# Patient Record
Sex: Female | Born: 1975 | Race: Black or African American | Hispanic: No | Marital: Married | Smoking: Never smoker
Health system: Southern US, Community
[De-identification: ages and names within clinical notes are randomized; demographics above are authoritative.]

## PROBLEM LIST (undated history)

## (undated) ENCOUNTER — Inpatient Hospital Stay (HOSPITAL_COMMUNITY): Payer: Self-pay

## (undated) DIAGNOSIS — R519 Headache, unspecified: Secondary | ICD-10-CM

## (undated) DIAGNOSIS — Z227 Latent tuberculosis: Secondary | ICD-10-CM

## (undated) DIAGNOSIS — M545 Low back pain, unspecified: Secondary | ICD-10-CM

## (undated) DIAGNOSIS — N926 Irregular menstruation, unspecified: Secondary | ICD-10-CM

## (undated) DIAGNOSIS — R51 Headache: Secondary | ICD-10-CM

## (undated) DIAGNOSIS — O0990 Supervision of high risk pregnancy, unspecified, unspecified trimester: Secondary | ICD-10-CM

## (undated) HISTORY — DX: Low back pain, unspecified: M54.50

## (undated) HISTORY — DX: Supervision of high risk pregnancy, unspecified, unspecified trimester: O09.90

## (undated) HISTORY — DX: Latent tuberculosis: Z22.7

## (undated) HISTORY — DX: Irregular menstruation, unspecified: N92.6

---
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
## 2011-12-19 ENCOUNTER — Other Ambulatory Visit: Payer: Self-pay | Admitting: Infectious Diseases

## 2011-12-19 ENCOUNTER — Ambulatory Visit
Admission: RE | Admit: 2011-12-19 | Discharge: 2011-12-19 | Disposition: A | Discharge status: Home / Self Care | Payer: No Typology Code available for payment source | Source: Ambulatory Visit | Attending: Infectious Diseases | Admitting: Infectious Diseases

## 2011-12-19 DIAGNOSIS — R7611 Nonspecific reaction to tuberculin skin test without active tuberculosis: Secondary | ICD-10-CM

## 2012-05-04 ENCOUNTER — Emergency Department (HOSPITAL_COMMUNITY)
Admission: EM | Admit: 2012-05-04 | Discharge: 2012-05-04 | Disposition: A | Discharge status: Home / Self Care | Payer: Self-pay | Source: Home / Self Care

## 2012-05-04 ENCOUNTER — Emergency Department (INDEPENDENT_AMBULATORY_CARE_PROVIDER_SITE_OTHER): Payer: Medicaid Other

## 2012-05-04 ENCOUNTER — Encounter (HOSPITAL_COMMUNITY): Payer: Self-pay | Admitting: *Deleted

## 2012-05-04 DIAGNOSIS — S8001XA Contusion of right knee, initial encounter: Secondary | ICD-10-CM

## 2012-05-04 DIAGNOSIS — S8000XA Contusion of unspecified knee, initial encounter: Secondary | ICD-10-CM

## 2012-05-04 MED ORDER — IBUPROFEN 800 MG PO TABS
800.0000 mg | ORAL_TABLET | Freq: Once | ORAL | Status: AC
  Administered 2012-05-04: 800 mg via ORAL

## 2012-05-04 MED ORDER — IBUPROFEN 800 MG PO TABS
ORAL_TABLET | ORAL | Status: AC
  Filled 2012-05-04: qty 1

## 2012-05-04 MED ORDER — IBUPROFEN 800 MG PO TABS: 800.0000 mg | ORAL_TABLET | Freq: Three times a day (TID) | ORAL | Status: DC

## 2012-05-04 NOTE — ED Notes (Signed)
Pt. Requested work and school notes.  Notes given as directed by PA.

## 2012-05-04 NOTE — ED Provider Notes (Signed)
History     CSN: [id number]  Arrival date & time 05/04/12  1645   None     Chief Complaint  Patient presents with  . Knee Pain    (Consider location/radiation/quality/duration/timing/severity/associated sxs/prior treatment) Patient is a 36 y.o. female presenting with knee pain. The history is provided by the patient. No language interpreter was used.  Knee Pain This is a new problem. The current episode started 3 to 5 hours ago. The problem occurs constantly. The problem has not changed since onset.The symptoms are aggravated by walking. Nothing relieves the symptoms.   Pt tripped over her shoe string and hit her knee.  Pt complains of swelling and pain History reviewed. No pertinent past medical history.  History reviewed. No pertinent past surgical history.  History reviewed. No pertinent family history.  History  Substance Use Topics  . Smoking status: Never Smoker   . Smokeless tobacco: Not on file  . Alcohol Use: No    OB History    Grav Para Term Preterm Abortions TAB SAB Ect Mult Living                  Review of Systems  Musculoskeletal: Positive for joint swelling.  All other systems reviewed and are negative.    Allergies  Review of patient's allergies indicates no known allergies.  Home Medications  No current outpatient prescriptions on file.  LMP 05/02/2012  Physical Exam  Nursing note and vitals reviewed. Constitutional: She is oriented to person, place, and time. She appears well-developed.  Musculoskeletal: She exhibits tenderness.       Tender right knee,  Swollen   No instability  Neurological: She is alert and oriented to person, place, and time.  Skin: Skin is warm.  Psychiatric: She has a normal mood and affect.    ED Course  Procedures (including critical care time)  Labs Reviewed - No data to display No results found.   No diagnosis found.    MDM  Knee sleeve,  rx for ibuprofen.   I advised follow up with Dr. Lestine Box  for recheck if pain persist past one week       Lonia Skinner Pars[place], [place] 05/04/12 1948

## 2012-05-04 NOTE — ED Notes (Signed)
Pt fell today after tripping on a shoe string.  C/o pain right knee.  Pt is able to ambulate, but limps

## 2012-05-04 NOTE — ED Notes (Signed)
Ice pack applied to right knee

## 2012-05-06 NOTE — ED Provider Notes (Signed)
Medical screening examination/treatment/procedure(s) were performed by resident physician or non-physician practitioner and as supervising physician I was immediately available for consultation/collaboration.   Kemiah Booz DOUGLAS MD.    Layana Konkel D Elchonon Maxson, MD 05/06/12 2126 

## 2012-07-12 ENCOUNTER — Encounter (HOSPITAL_COMMUNITY): Payer: Self-pay | Admitting: Family Medicine

## 2012-07-12 ENCOUNTER — Emergency Department (HOSPITAL_COMMUNITY)
Admission: EM | Admit: 2012-07-12 | Discharge: 2012-07-13 | Disposition: A | Discharge status: Home / Self Care | Payer: Medicaid Other | Attending: Emergency Medicine | Admitting: Emergency Medicine

## 2012-07-12 DIAGNOSIS — R109 Unspecified abdominal pain: Secondary | ICD-10-CM | POA: Insufficient documentation

## 2012-07-12 DIAGNOSIS — O200 Threatened abortion: Secondary | ICD-10-CM | POA: Insufficient documentation

## 2012-07-12 LAB — CBC WITH DIFFERENTIAL/PLATELET
Basophils Absolute: 0 10*3/uL (ref 0.0–0.1)
Eosinophils Absolute: 0.2 10*3/uL (ref 0.0–0.7)
Eosinophils Relative: 4 % (ref 0–5)
MCH: 31.2 pg (ref 26.0–34.0)
MCHC: 34.8 g/dL (ref 30.0–36.0)
MCV: 89.7 fL (ref 78.0–100.0)
Platelets: 298 10*3/uL (ref 150–400)
RDW: 12.2 % (ref 11.5–15.5)

## 2012-07-12 LAB — COMPREHENSIVE METABOLIC PANEL
ALT: 11 U/L (ref 0–35)
AST: 18 U/L (ref 0–37)
Calcium: 9.5 mg/dL (ref 8.4–10.5)
GFR calc Af Amer: >90 mL/min (ref >90)
Glucose, Bld: 95 mg/dL (ref 70–99)
Sodium: 136 mEq/L (ref 135–145)
Total Protein: 7.2 g/dL (ref 6.0–8.3)

## 2012-07-12 LAB — URINE MICROSCOPIC-ADD ON

## 2012-07-12 LAB — URINALYSIS, ROUTINE W REFLEX MICROSCOPIC
Bilirubin Urine: NEGATIVE
Protein, ur: 30 mg/dL — AB
Urobilinogen, UA: 1 mg/dL (ref 0.0–1.0)

## 2012-07-12 NOTE — ED Notes (Addendum)
Patient POCT  Pregnancy  was Positive.Triage Nurse was informed of results.

## 2012-07-12 NOTE — ED Notes (Signed)
Per pt sts positive pregnancy test and now she is bleeding. sts she has been through 3 pads. sts abdominal cramping and back pain.

## 2012-07-13 ENCOUNTER — Emergency Department (HOSPITAL_COMMUNITY): Payer: Medicaid Other

## 2012-07-13 LAB — ABO/RH: ABO/RH(D): O POS

## 2012-07-13 MED ORDER — HYDROCODONE-ACETAMINOPHEN 5-325 MG PO TABS
1.0000 | ORAL_TABLET | Freq: Once | ORAL | Status: AC
  Administered 2012-07-13: 1 via ORAL
  Filled 2012-07-13: qty 1

## 2012-07-13 NOTE — ED Provider Notes (Addendum)
History     CSN: [id number]  Arrival date & time 07/12/12  7829   First MD Initiated Contact with Patient 07/12/12 2338      Chief Complaint  Patient presents with  . Threatened Miscarriage    (Consider location/radiation/quality/duration/timing/severity/associated sxs/prior treatment) Patient is a 37 y.o. female presenting with vaginal bleeding.  Vaginal Bleeding This is a new problem. The current episode started 12 to 24 hours ago. The problem occurs constantly. The problem has not changed since onset.Associated symptoms include abdominal pain. Nothing aggravates the symptoms. Nothing relieves the symptoms. She has tried nothing for the symptoms.    History reviewed. No pertinent past medical history.  History reviewed. No pertinent past surgical history.  History reviewed. No pertinent family history.  History  Substance Use Topics  . Smoking status: Never Smoker   . Smokeless tobacco: Not on file  . Alcohol Use: No    OB History    Grav Para Term Preterm Abortions TAB SAB Ect Mult Living   1               Review of Systems  Gastrointestinal: Positive for abdominal pain.  Genitourinary: Positive for vaginal bleeding.  All other systems reviewed and are negative.    Allergies  Review of patient's allergies indicates no known allergies.  Home Medications  No current outpatient prescriptions on file.  BP 119/72  Pulse 85  Temp 98.3 F (36.8 C)  Resp 18  SpO2 99%  LMP 05/27/2012  Physical Exam  Constitutional: She is oriented to person, place, and time. She appears well-developed and well-nourished.  HENT:  Head: Normocephalic and atraumatic.  Eyes: Conjunctivae normal and EOM are normal. Pupils are equal, round, and reactive to light.  Neck: Normal range of motion.  Cardiovascular: Normal rate, regular rhythm and normal heart sounds.   Pulmonary/Chest: Effort normal and breath sounds normal.  Abdominal: Soft. Bowel sounds are normal.    Genitourinary:       Moderate vag bleeding, with clots  Musculoskeletal: Normal range of motion.  Neurological: She is alert and oriented to person, place, and time.  Skin: Skin is warm and dry.  Psychiatric: She has a normal mood and affect. Her behavior is normal.    ED Course  Procedures (including critical care time)  Labs Reviewed  URINALYSIS, ROUTINE W REFLEX MICROSCOPIC - Abnormal; Notable for the following:    Color, Urine RED (*)  BIOCHEMICALS MAY BE AFFECTED BY COLOR   APPearance CLOUDY (*)     Hgb urine dipstick LARGE (*)     Ketones, ur 15 (*)     Protein, ur 30 (*)     Leukocytes, UA SMALL (*)     All other components within normal limits  COMPREHENSIVE METABOLIC PANEL - Abnormal; Notable for the following:    Total Bilirubin 0.2 (*)     GFR calc non Af Amer 89 (*)     All other components within normal limits  CBC WITH DIFFERENTIAL  URINE MICROSCOPIC-ADD ON   No results found.   No diagnosis found.    MDM  + vag bleeding,  Preg.  Will quant,  [place],  reassess    O+.  + threatened miscarriage.  Will dc to fu    Hajar Penninger Lytle Michaels, MD 07/13/12 0006  Steve Gregg Lytle Michaels, MD 07/13/12 5621  Rosanne Ashing, MD 07/13/12 3086

## 2012-07-13 NOTE — ED Notes (Signed)
Patient has been bleeding off and on.  Used an at home pregnancy test and  It was positive

## 2012-07-15 ENCOUNTER — Inpatient Hospital Stay (HOSPITAL_COMMUNITY)
Admission: AD | Admit: 2012-07-15 | Discharge: 2012-07-15 | Disposition: A | Discharge status: Home / Self Care | Payer: Medicaid Other | Source: Ambulatory Visit | Attending: Obstetrics & Gynecology | Admitting: Obstetrics & Gynecology

## 2012-07-15 ENCOUNTER — Encounter (HOSPITAL_COMMUNITY): Payer: Self-pay

## 2012-07-15 DIAGNOSIS — O209 Hemorrhage in early pregnancy, unspecified: Secondary | ICD-10-CM | POA: Insufficient documentation

## 2012-07-15 NOTE — MAU Provider Note (Signed)
Chief Complaint  Patient presents with  . Vaginal Bleeding    Subjective Nancy Miles 37 y.o.  [id number] [redacted]w[redacted]d by LMP had onset 3 days ago of bleeding like a period with clots. Seen at [facility] and when she went home passed either a large clot or tissue indicating about 4 cm size. Bleeding has been light since then.  Had crampy lower abdominal pain up until today, and now has no pain. 07/13/11: Hgb 13.3, A pos, quant 1633. [place]: IUGS [redacted]w[redacted]d size, no YS, no embryo, otherwise essentially nl US.(see report below).    ROS: No fever/chills, malaise. Denies irritative vaginitis.  No dysuria or hematuria.    Pertinent medical history: [place] Pertinent Ob/Gyn history:P1 C/S transverse lie, VBAC x2 Pertinent surgical history: C/S    Objective     Physical Exam General: WN/WD in NAD  Abdom: soft, NT Pelvic:External genitalia: normal; BUS neg              Bimanual: Cx closed, long; no CMT, scant dark blood                             Uterus anteverted, NT, ULNS                             Adnexae non tender, no masses   Lab Results Results for orders placed during the hospital encounter of 07/15/12 (from the past 24 hour(s))  HCG, QUANTITATIVE, PREGNANCY     Status: Abnormal   Collection Time   07/15/12 10:48 AM      Component Value Range   hCG, Beta Chain, Quant, S 347 (*) <5 mIU/mL    Ultrasound *RADIOLOGY REPORT*  Clinical Data: Threatened abortion.  OBSTETRIC <14 WK [place] AND TRANSVAGINAL OB US  Technique: Both transabdominal and transvaginal ultrasound  examinations were performed for complete evaluation of the  gestation as well as the maternal uterus, adnexal regions, and  pelvic cul-de-sac. Transvaginal technique was performed to assess  early pregnancy.  Comparison: None.  Intrauterine gestational sac: Visualized/normal in shape.  Yolk sac: No  Embryo: No  Cardiac Activity: N/A  MSD: 5.2 mm 5 w 2 d  Maternal uterus/adnexae:  There is mild nonspecific heterogeneity at the lower  uterine  segment; this could reflect mild clot or debris. The uterus is  otherwise unremarkable in appearance.  The ovaries are within normal limits. The right ovary measures 3.0  x 2.9 x 2.0 cm, while the left ovary measures 2.2 x 1.7 x 1.5 cm.  No suspicious adnexal masses are seen; there is no evidence for  ovarian torsion. Limited Doppler evaluation demonstrates normal  color Doppler blood flow with respect to both ovaries.  A small amount of fluid is noted within the pelvic cul-de-sac,  extending to the right adnexa.  IMPRESSION:  1. Single intrauterine gestational sac noted, with a mean sac  diameter of 5.2 mm. This corresponds to a gestational age of [redacted]  weeks 2 days, which does not match the gestational age of [redacted] weeks 5  days by LMP. However, the size of the gestational sac remains too  small to provide an estimated date of delivery. Would perform  follow-up pelvic ultrasound in 2 weeks to ensure viability of  pregnancy, given clinical concern.  2. Mild nonspecific heterogeneity at the lower uterine segment  could reflect mild clot or debris. Uterus otherwise unremarkable  in appearance.  Assessment 1. Bleeding in early pregnancy   Probable completed SAB, but IUP not confirmed.     Plan    GC/CT sent Discharge with AVS on Early Pregnancy Bleeding, ectopic precautions Follow-up Information    Follow up with [facility]. (Someone from GYN clinic will call you for appointment for lab and visit to discuss birth control)    Contact information:   [phone]        POE,DEIRDRE 07/15/2012 10:58 AM

## 2012-07-15 NOTE — MAU Note (Signed)
Patient states she was seen at [facility] ED on 1-20 for bleeding. States she continues to have a little bleeding but no pain.

## 2012-07-15 NOTE — MAU Note (Signed)
Pt states is changing a pad q30 minutes.

## 2012-07-16 ENCOUNTER — Telehealth: Payer: Self-pay | Admitting: Obstetrics and Gynecology

## 2012-07-16 NOTE — Telephone Encounter (Addendum)
Message copied by Toula Moos on Fri Jul 16, 2012  9:04 AM ------      Message from: POE, DEIRDRE C      Created: Thu Jul 15, 2012 12:22 PM  Called patient on the only phone number on file: [phone] and a lady answered the phone stating that this was a wrong number for the name I was calling for. I read back phone number to her and states that the phone number is correct however she does not know anyone by the name of Nancy Miles. Appointment CXL'ed and letter sent to patient by front desk to call us back with correct phone number and make appt as instructed by Diedre Poe below.         Probable SAB. Follow quant down to <2. Schedule quant 1 day before visit in about 10 d to discuss contraception

## 2012-07-20 ENCOUNTER — Encounter: Payer: Self-pay | Admitting: Obstetrics & Gynecology

## 2012-07-20 ENCOUNTER — Other Ambulatory Visit: Payer: Medicaid Other

## 2012-07-30 ENCOUNTER — Ambulatory Visit (INDEPENDENT_AMBULATORY_CARE_PROVIDER_SITE_OTHER): Payer: Medicaid Other | Admitting: Medical

## 2012-07-30 ENCOUNTER — Encounter: Payer: Self-pay | Admitting: Medical

## 2012-07-30 VITALS — BP 104/71 | HR 77 | Temp 98.0°F | Wt 125.5 lb

## 2012-07-30 DIAGNOSIS — O039 Complete or unspecified spontaneous abortion without complication: Secondary | ICD-10-CM | POA: Insufficient documentation

## 2012-07-30 NOTE — Patient Instructions (Signed)
Miscarriage  A miscarriage is the loss of an unborn baby (fetus) before the 20th week of pregnancy. The cause is often unknown.   HOME CARE   You may need to stay in bed (bed rest), or you may be able to do light activity. Go about activity as told by your doctor.   Have help at home.   Write down how many pads you use each day. Write down how soaked they are.   Do not use tampons. Do not wash out your vagina (douche) or have sex (intercourse) until your doctor approves.   Only take medicine as told by your doctor.   Do not take aspirin.   Keep all doctor visits as told.   If you or your partner have problems with grieving, talk to your doctor. You can also try counseling. Give yourself time to grieve before trying to get pregnant again.  GET HELP RIGHT AWAY IF:   You have bad cramps or pain in your back or belly (abdomen).   You have a fever.   You pass large clumps of blood (clots) from your vagina that are walnut-sized or larger. Save the clumps for your doctor to see.   You pass large amounts of tissue from your vagina. Save the tissue for your doctor to see.   You have more bleeding.   You have thick, bad-smelling fluid (discharge) coming from the vagina.   You get lightheaded, weak, or you pass out (faint).   You have chills.  MAKE SURE YOU:   Understand these instructions.   Will watch your condition.   Will get help right away if you are not doing well or get worse.  Document Released: 09/01/2011 Document Reviewed: 09/01/2011  [employer] Patient Information 2013 [employer].

## 2012-07-30 NOTE — Progress Notes (Signed)
Patient ID: Nancy Miles, female   DOB: 1976-01-23, 37 y.o.   MRN: [id number]  History:  Nancy Miles  is a 37 y.o. [phone] who presents to clinic today for follow-up after SAB. The patient was seen in MAU for SAB approximately 2 weeks ago. The patient denies bleeding today. The bleeding had stopped a little over 1 week ago. The patient denies pain today. She has not have fever or N/V. She did not return as requested for her follow-up quant, hCG yesterday. She is interested in the IUD for birth control.    The following portions of the patient's history were reviewed and updated as appropriate: allergies, current medications, past family history, past medical history, past social history, past surgical history and problem list.  Review of Systems:  Pertinent items are noted in HPI.  Objective:  Physical Exam BP 104/71  Pulse 77  Temp 98 F (36.7 C)  Wt 125 lb 8 oz (56.926 kg)  LMP 05/27/2012  Breastfeeding? Unknown GENERAL: Well-developed, well-nourished female in no acute distress.  HEENT: Normocephalic, atraumatic.  LUNGS: Normal rate.  HEART: Regular rate EXTREMITIES: No cyanosis, clubbing, or edema   Labs and Imaging US Ob Comp Less 14 Wks  07/13/2012  *RADIOLOGY REPORT*  Clinical Data: Threatened abortion.  OBSTETRIC <14 WK [place] AND TRANSVAGINAL OB US  Technique:  Both transabdominal and transvaginal ultrasound examinations were performed for complete evaluation of the gestation as well as the maternal uterus, adnexal regions, and pelvic cul-de-sac.  Transvaginal technique was performed to assess early pregnancy.  Comparison:  None.  Intrauterine gestational sac:  Visualized/normal in shape. Yolk sac: No Embryo: No Cardiac Activity: N/A  MSD: 5.2 mm  5 w 2 d  Maternal uterus/adnexae: There is mild nonspecific heterogeneity at the lower uterine segment; this could reflect mild clot or debris.  The uterus is otherwise unremarkable in appearance.  The ovaries are within normal limits.   The right ovary measures 3.0 x 2.9 x 2.0 cm, while the left ovary measures 2.2 x 1.7 x 1.5 cm. No suspicious adnexal masses are seen; there is no evidence for ovarian torsion.  Limited Doppler evaluation demonstrates normal color Doppler blood flow with respect to both ovaries.  A small amount of fluid is noted within the pelvic cul-de-sac, extending to the right adnexa.  IMPRESSION:  1.  Single intrauterine gestational sac noted, with a mean sac diameter of 5.2 mm. This corresponds to a gestational age of [redacted] weeks 2 days, which does not match the gestational age of [redacted] weeks 5 days by LMP.  However, the size of the gestational sac remains too small to provide an estimated date of delivery.  Would perform follow-up pelvic ultrasound in 2 weeks to ensure viability of pregnancy, given clinical concern. 2.  Mild nonspecific heterogeneity at the lower uterine segment could reflect mild clot or debris.  Uterus otherwise unremarkable in appearance.   Original Report Authenticated By: Tonia Ghent, M.D.      Assessment & Plan:  Assessment: S/P SAB - 2 weeks Contraceptive counseling  Plans: 1. Quant, hCG today. Will contact patient with results. If < 5 we can schedule for IUD insertion at patient's convenience. If > 5 patient will return in 1 week for follow-up quant hCG 2. Patient instructed to use condoms with intercourse if she has intercourse prior to IUD insertion 3. Patient may return to clinic PRN or as instructed based on lab results  Freddi Starr, PA-C 07/30/2012 12:16 PM

## 2012-08-02 ENCOUNTER — Telehealth: Payer: Self-pay | Admitting: *Deleted

## 2012-08-02 NOTE — Telephone Encounter (Signed)
Message copied by Gerome Apley on Mon Aug 02, 2012  2:38 PM ------      Message from: Freddi Starr      Created: Fri Jul 30, 2012  7:51 PM       Quant hCG <5.. Please make patient appointment for IUD insertion in 2-3 weeks and call her with the results and appointment information. Thanks! ------

## 2012-08-02 NOTE — Telephone Encounter (Signed)
Called patient with [employer] interpreter, patient's husband answered and stated she was at work. Left message with husband to tell her to call us back for appt information. Patient's husband stated she would call back tomorrow. IUD appt has been made for 2/24 @ 1:45

## 2012-08-04 NOTE — Telephone Encounter (Signed)
Called pt with [employer] interpreter # [phone] and left message that pt has an appt scheduled here at the clinics for 2/24 @ 1:45pm.  If she has any questions to please give the clinics a call and left our return telephone #.

## 2012-08-16 ENCOUNTER — Ambulatory Visit: Payer: Medicaid Other | Admitting: Advanced Practice Midwife

## 2012-08-27 ENCOUNTER — Ambulatory Visit: Payer: Medicaid Other | Admitting: Medical

## 2012-09-17 ENCOUNTER — Ambulatory Visit (INDEPENDENT_AMBULATORY_CARE_PROVIDER_SITE_OTHER): Payer: Medicaid Other | Admitting: Medical

## 2012-09-17 ENCOUNTER — Encounter: Payer: Self-pay | Admitting: Medical

## 2012-09-17 VITALS — BP 99/68 | HR 83 | Temp 97.2°F | Ht 61.5 in | Wt 119.6 lb

## 2012-09-17 DIAGNOSIS — O039 Complete or unspecified spontaneous abortion without complication: Secondary | ICD-10-CM

## 2014-03-19 ENCOUNTER — Emergency Department (INDEPENDENT_AMBULATORY_CARE_PROVIDER_SITE_OTHER)
Admission: EM | Admit: 2014-03-19 | Discharge: 2014-03-19 | Disposition: A | Discharge status: Home / Self Care | Payer: BC Managed Care – PPO | Source: Home / Self Care | Attending: Family Medicine | Admitting: Family Medicine

## 2014-03-19 ENCOUNTER — Encounter (HOSPITAL_COMMUNITY): Payer: Self-pay | Admitting: Emergency Medicine

## 2014-03-19 DIAGNOSIS — J029 Acute pharyngitis, unspecified: Secondary | ICD-10-CM

## 2014-03-19 LAB — POCT RAPID STREP A: Streptococcus, Group A Screen (Direct): NEGATIVE

## 2014-03-19 MED ORDER — IBUPROFEN 100 MG/5ML PO SUSP
ORAL | Status: AC
  Filled 2014-03-19: qty 30

## 2014-03-19 MED ORDER — IBUPROFEN 100 MG/5ML PO SUSP
600.0000 mg | Freq: Once | ORAL | Status: AC
  Administered 2014-03-19: 600 mg via ORAL

## 2014-03-19 MED ORDER — AMOXICILLIN 500 MG PO CAPS: 500.0000 mg | ORAL_CAPSULE | Freq: Three times a day (TID) | ORAL | Status: DC

## 2014-03-19 MED ORDER — IPRATROPIUM BROMIDE 0.06 % NA SOLN: 2.0000 | Freq: Four times a day (QID) | NASAL | Status: DC

## 2014-03-19 NOTE — ED Notes (Signed)
C/O extremely painful swallowing, bilat earache, lymphadenopathy, and mild fevers at home x 3 days.  Has not been taking any meds.

## 2014-03-19 NOTE — ED Provider Notes (Signed)
CSN: [id number]     Arrival date & time 03/19/14  1240 History   First MD Initiated Contact with Patient 03/19/14 1256     Chief Complaint  Patient presents with  . Sore Throat  . Lymphadenopathy   (Consider location/radiation/quality/duration/timing/severity/associated sxs/prior Treatment) Patient is a 38 y.o. female presenting with pharyngitis. The history is provided by the patient.  Sore Throat This is a new problem. The current episode started more than 2 days ago. The problem has been gradually worsening. Pertinent negatives include no chest pain and no abdominal pain. The symptoms are aggravated by swallowing.    Past Medical History  Diagnosis Date  . No pertinent past medical history    Past Surgical History  Procedure Laterality Date  . Cesarean section      transverse lie   Family History  Problem Relation Age of Onset  . Other Neg Hx    History  Substance Use Topics  . Smoking status: Never Smoker   . Smokeless tobacco: Never Used  . Alcohol Use: No   OB History   Grav Para Term Preterm Abortions TAB SAB Ect Mult Living   Review of Systems  Constitutional: Negative.   HENT: Positive for congestion, ear pain, postnasal drip and sore throat.   Respiratory: Negative.   Cardiovascular: Negative.  Negative for chest pain.  Gastrointestinal: Negative for abdominal pain.  Hematological: Positive for adenopathy.    Allergies  Review of patient's allergies indicates no known allergies.  Home Medications   Prior to Admission medications   Medication Sig Start Date End Date Taking? Authorizing Provider  acetaminophen (TYLENOL) 500 MG tablet Take 500 mg by mouth every 6 (six) hours as needed. Takes for pain    Historical Provider, MD  amoxicillin (AMOXIL) 500 MG capsule Take 1 capsule (500 mg total) by mouth 3 (three) times daily. 03/19/14   Linna Hoff, MD  ipratropium (ATROVENT) 0.06 % nasal spray Place 2 sprays into both nostrils 4 (four)  times daily. 03/19/14   Linna Hoff, MD   BP 103/73  Pulse 84  Temp(Src) 98.2 F (36.8 C) (Oral)  Resp 16  SpO2 98% Physical Exam  Nursing note and vitals reviewed. Constitutional: She appears well-developed and well-nourished.  HENT:  Right Ear: External ear normal.  Left Ear: External ear normal.  Mouth/Throat: Uvula is midline and mucous membranes are normal. Posterior oropharyngeal erythema present. No oropharyngeal exudate.  Eyes: Conjunctivae are normal. Pupils are equal, round, and reactive to light.  Neck: Normal range of motion. Neck supple.  Lymphadenopathy:    She has cervical adenopathy.    ED Course  Procedures (including critical care time) Labs Review Labs Reviewed  POCT RAPID STREP A ([facility] CARE ONLY)    Imaging Review No results found.   MDM   1. Acute pharyngitis, unspecified pharyngitis type        Linna Hoff, MD 03/19/14 1314

## 2014-03-21 LAB — CULTURE, GROUP A STREP

## 2014-04-24 ENCOUNTER — Encounter (HOSPITAL_COMMUNITY): Payer: Self-pay | Admitting: Emergency Medicine

## 2016-09-08 ENCOUNTER — Inpatient Hospital Stay (HOSPITAL_COMMUNITY)
Admission: AD | Admit: 2016-09-08 | Discharge: 2016-09-08 | Disposition: A | Discharge status: Home / Self Care | Payer: PRIVATE HEALTH INSURANCE | Source: Ambulatory Visit | Attending: Obstetrics and Gynecology | Admitting: Obstetrics and Gynecology

## 2016-09-08 ENCOUNTER — Encounter (HOSPITAL_COMMUNITY): Payer: Self-pay | Admitting: *Deleted

## 2016-09-08 ENCOUNTER — Ambulatory Visit (HOSPITAL_COMMUNITY): Admission: EM | Admit: 2016-09-08 | Discharge: 2016-09-08 | Discharge status: Absent without leave | Payer: Self-pay

## 2016-09-08 ENCOUNTER — Inpatient Hospital Stay (HOSPITAL_COMMUNITY): Payer: PRIVATE HEALTH INSURANCE

## 2016-09-08 DIAGNOSIS — N760 Acute vaginitis: Secondary | ICD-10-CM | POA: Insufficient documentation

## 2016-09-08 DIAGNOSIS — B9689 Other specified bacterial agents as the cause of diseases classified elsewhere: Secondary | ICD-10-CM | POA: Insufficient documentation

## 2016-09-08 DIAGNOSIS — O418X10 Other specified disorders of amniotic fluid and membranes, first trimester, not applicable or unspecified: Secondary | ICD-10-CM

## 2016-09-08 DIAGNOSIS — Z3A01 Less than 8 weeks gestation of pregnancy: Secondary | ICD-10-CM | POA: Diagnosis not present

## 2016-09-08 DIAGNOSIS — O23591 Infection of other part of genital tract in pregnancy, first trimester: Secondary | ICD-10-CM | POA: Diagnosis not present

## 2016-09-08 DIAGNOSIS — O468X1 Other antepartum hemorrhage, first trimester: Secondary | ICD-10-CM

## 2016-09-08 DIAGNOSIS — O208 Other hemorrhage in early pregnancy: Secondary | ICD-10-CM | POA: Insufficient documentation

## 2016-09-08 DIAGNOSIS — R109 Unspecified abdominal pain: Secondary | ICD-10-CM | POA: Diagnosis present

## 2016-09-08 DIAGNOSIS — O26899 Other specified pregnancy related conditions, unspecified trimester: Secondary | ICD-10-CM

## 2016-09-08 HISTORY — DX: Headache, unspecified: R51.9

## 2016-09-08 HISTORY — DX: Headache: R51

## 2016-09-08 LAB — CBC WITH DIFFERENTIAL/PLATELET
BASOS ABS: 0 10*3/uL (ref 0.0–0.1)
BASOS PCT: 0 %
EOS ABS: 0.1 10*3/uL (ref 0.0–0.7)
Eosinophils Relative: 1 %
HCT: 35.3 % — ABNORMAL LOW (ref 36.0–46.0)
Hemoglobin: 12.2 g/dL (ref 12.0–15.0)
Lymphocytes Relative: 39 %
Lymphs Abs: 2.1 10*3/uL (ref 0.7–4.0)
MCH: 31.1 pg (ref 26.0–34.0)
MCHC: 34.6 g/dL (ref 30.0–36.0)
MCV: 90.1 fL (ref 78.0–100.0)
MONO ABS: 0.4 10*3/uL (ref 0.1–1.0)
MONOS PCT: 7 %
NEUTROS ABS: 2.8 10*3/uL (ref 1.7–7.7)
NEUTROS PCT: 53 %
Platelets: 331 10*3/uL (ref 150–400)
RBC: 3.92 MIL/uL (ref 3.87–5.11)
RDW: 12.5 % (ref 11.5–15.5)
WBC: 5.4 10*3/uL (ref 4.0–10.5)

## 2016-09-08 LAB — WET PREP, GENITAL
Sperm: NONE SEEN
TRICH WET PREP: NONE SEEN
YEAST WET PREP: NONE SEEN

## 2016-09-08 LAB — URINALYSIS, ROUTINE W REFLEX MICROSCOPIC
BILIRUBIN URINE: NEGATIVE
Bacteria, UA: NONE SEEN
Glucose, UA: NEGATIVE mg/dL
Ketones, ur: NEGATIVE mg/dL
NITRITE: NEGATIVE
Protein, ur: NEGATIVE mg/dL
SPECIFIC GRAVITY, URINE: 1.02 (ref 1.005–1.030)
pH: 5 (ref 5.0–8.0)

## 2016-09-08 LAB — POCT PREGNANCY, URINE: Preg Test, Ur: POSITIVE — AB

## 2016-09-08 LAB — HCG, QUANTITATIVE, PREGNANCY: HCG, BETA CHAIN, QUANT, S: 3008 m[IU]/mL — AB (ref <5)

## 2016-09-08 MED ORDER — METRONIDAZOLE 500 MG PO TABS: 500.0000 mg | ORAL_TABLET | Freq: Two times a day (BID) | ORAL | 0 refills | Status: DC

## 2016-09-08 NOTE — MAU Note (Signed)
c/o cabdominal cramping and lower back pain for past 2 days; c/o spotting for past 2 days also;  UPT in MAU today is positive;

## 2016-09-08 NOTE — Discharge Instructions (Signed)
Bacterial Vaginosis °Bacterial vaginosis is an infection of the vagina. It happens when too many germs (bacteria) grow in the vagina. This infection puts you at risk for infections from sex (STIs). Treating this infection can lower your risk for some STIs. You should also treat this if you are pregnant. It can cause your baby to be born early. °Follow these instructions at home: °Medicines °· Take over-the-counter and prescription medicines only as told by your doctor. °· Take or use your antibiotic medicine as told by your doctor. Do not stop taking or using it even if you start to feel better. °General instructions °· If you your sexual partner is a woman, tell her that you have this infection. She needs to get treatment if she has symptoms. If you have a female partner, he does not need to be treated. °· During treatment: °? Avoid sex. °? Do not douche. °? Avoid alcohol as told. °? Avoid breastfeeding as told. °· Drink enough fluid to keep your pee (urine) clear or pale yellow. °· Keep your vagina and butt (rectum) clean. °? Wash the area with warm water every day. °? Wipe from front to back after you use the toilet. °· Keep all follow-up visits as told by your doctor. This is important. °Preventing this condition °· Do not douche. °· Use only warm water to wash around your vagina. °· Use protection when you have sex. This includes: °? Latex condoms. °? Dental dams. °· Limit how many people you have sex with. It is best to only have sex with the same person (be monogamous). °· Get tested for STIs. Have your partner get tested. °· Wear underwear that is cotton or lined with cotton. °· Avoid tight pants and pantyhose. This is most important in summer. °· Do not use any products that have nicotine or tobacco in them. These include cigarettes and e-cigarettes. If you need help quitting, ask your doctor. °· Do not use illegal drugs. °· Limit how much alcohol you drink. °Contact a doctor if: °· Your symptoms do not get  better, even after you are treated. °· You have more discharge or pain when you pee (urinate). °· You have a fever. °· You have pain in your belly (abdomen). °· You have pain with sex. °· Your bleed from your vagina between periods. °Summary °· This infection happens when too many germs (bacteria) grow in the vagina. °· Treating this condition can lower your risk for some infections from sex (STIs). °· You should also treat this if you are pregnant. It can cause early (premature) birth. °· Do not stop taking or using your antibiotic medicine even if you start to feel better. °This information is not intended to replace advice given to you by your health care provider. Make sure you discuss any questions you have with your health care provider. °Document Released: 03/18/2008 Document Revised: 02/23/2016 Document Reviewed: 02/23/2016 °Elsevier Interactive Patient Education © 2017 Elsevier Inc. °Subchorionic Hematoma °A subchorionic hematoma is a gathering of blood between the outer wall of the placenta and the inner wall of the womb (uterus). The placenta is the organ that connects the fetus to the wall of the uterus. The placenta performs the feeding, breathing (oxygen to the fetus), and waste removal (excretory work) of the fetus. °Subchorionic hematoma is the most common abnormality found on a result from ultrasonography done during the first trimester or early second trimester of pregnancy. If there has been little or no vaginal bleeding, early small hematomas usually   shrink on their own and do not affect your baby or pregnancy. The blood is gradually absorbed over 1-2 weeks. When bleeding starts later in pregnancy or the hematoma is larger or occurs in an older pregnant woman, the outcome may not be as good. Larger hematomas may get bigger, which increases the chances for miscarriage. Subchorionic hematoma also increases the risk of premature detachment of the placenta from the uterus, preterm (premature) labor,  and stillbirth. °Follow these instructions at home: °· Stay on bed rest if your health care provider recommends this. Although bed rest will not prevent more bleeding or prevent a miscarriage, your health care provider may recommend bed rest until you are advised otherwise. °· Avoid heavy lifting (more than 10 lb [4.5 kg]), exercise, sexual intercourse, or douching as directed by your health care provider. °· Keep track of the number of pads you use each day and how soaked (saturated) they are. Write down this information. °· Do not use tampons. °· Keep all follow-up appointments as directed by your health care provider. Your health care provider may ask you to have follow-up blood tests or ultrasound tests or both. °Get help right away if: °· You have severe cramps in your stomach, back, abdomen, or pelvis. °· You have a fever. °· You pass large clots or tissue. Save any tissue for your health care provider to look at. °· Your bleeding increases or you become lightheaded, feel weak, or have fainting episodes. °This information is not intended to replace advice given to you by your health care provider. Make sure you discuss any questions you have with your health care provider. °Document Released: 09/24/2006 Document Revised: 11/15/2015 Document Reviewed: 01/06/2013 °Elsevier Interactive Patient Education © 2017 Elsevier Inc. ° °

## 2016-09-08 NOTE — [facility] Provider Note (Signed)
History     CSN: [id number]  Arrival date and time: 09/08/16 1719   First Provider Initiated Contact with Patient 09/08/16 1753      Chief Complaint  Patient presents with  . Abdominal Pain  . Back Pain  . Vaginal Bleeding   HPI Ms. Nancy Miles is a 40 y.o. G5P3003 at [redacted]w[redacted]d who presents to [facility] today with complaint of vaginal bleeding and lower abdomen and back pain x 2 days. The patient states LMP 07/19/16 and +HPT recently. She states only very small amount of blood noted so far. She states back pain is moderate and abdominal cramping in minimal. She has not taken anything for pain. She denies fever, N/V/D or constipation or UTI symptoms.   OB History    Gravida Para Term Preterm AB Living   5 3 3     3    SAB TAB Ectopic Multiple Live Births           3      Past Medical History:  Diagnosis Date  . Headache   . No pertinent past medical history     Past Surgical History:  Procedure Laterality Date  . CESAREAN SECTION     transverse lie    Family History  Problem Relation Age of Onset  . Other Neg Hx     Social History  Substance Use Topics  . Smoking status: Never Smoker  . Smokeless tobacco: Never Used  . Alcohol use No    Allergies: No Known Allergies  Prescriptions Prior to Admission  Medication Sig Dispense Refill Last Dose  . ibuprofen (ADVIL,MOTRIN) 200 MG tablet Take 400 mg by mouth every 6 (six) hours as needed for headache.   09/07/2016 at Unknown time  . amoxicillin (AMOXIL) 500 MG capsule Take 1 capsule (500 mg total) by mouth 3 (three) times daily. (Patient not taking: Reported on 09/08/2016) 30 capsule 0 Not Taking at Unknown time  . ipratropium (ATROVENT) 0.06 % nasal spray Place 2 sprays into both nostrils 4 (four) times daily. (Patient not taking: Reported on 09/08/2016) 15 mL 1 Not Taking at Unknown time    Review of Systems  Constitutional: Negative for fever.  Gastrointestinal: Positive for abdominal pain. Negative for constipation,  diarrhea, nausea and vomiting.  Genitourinary: Positive for vaginal bleeding. Negative for dysuria, frequency, urgency and vaginal discharge.  Musculoskeletal: Positive for back pain.   Physical Exam   Blood pressure 106/64, pulse 85, temperature 98.3 F (36.8 C), temperature source Oral, resp. rate 16, last menstrual period 07/19/2016.  Physical Exam  Nursing note and vitals reviewed. Constitutional: She is oriented to person, place, and time. She appears well-developed and well-nourished. No distress.  HENT:  Head: Normocephalic and atraumatic.  Cardiovascular: Normal rate.   Respiratory: Effort normal.  GI: Soft. She exhibits no distension and no mass. There is no tenderness. There is no rebound and no guarding.  Genitourinary: Uterus is enlarged (slightly). Uterus is not tender. Cervix exhibits no motion tenderness, no discharge and no friability. Right adnexum displays no mass and no tenderness. Left adnexum displays no mass and no tenderness. There is bleeding (scant blood) in the vagina. No vaginal discharge found.  Neurological: She is alert and oriented to person, place, and time.  Skin: Skin is warm and dry. No erythema.  Psychiatric: She has a normal mood and affect.    Results for orders placed or performed during the hospital encounter of 09/08/16 (from the past 24 hour(s))  Urinalysis, Routine w reflex microscopic  Status: Abnormal   Collection Time: 09/08/16  5:30 PM  Result Value Ref Range   Color, Urine YELLOW YELLOW   APPearance CLEAR CLEAR   Specific Gravity, Urine 1.020 1.005 - 1.030   pH 5.0 5.0 - 8.0   Glucose, UA NEGATIVE NEGATIVE mg/dL   Hgb urine dipstick LARGE (A) NEGATIVE   Bilirubin Urine NEGATIVE NEGATIVE   Ketones, ur NEGATIVE NEGATIVE mg/dL   Protein, ur NEGATIVE NEGATIVE mg/dL   Nitrite NEGATIVE NEGATIVE   Leukocytes, UA TRACE (A) NEGATIVE   RBC / HPF 6-30 0 - 5 RBC/hpf   WBC, UA 0-5 0 - 5 WBC/hpf   Bacteria, UA NONE SEEN NONE SEEN    Squamous Epithelial / LPF 0-5 (A) NONE SEEN   Mucous PRESENT   Pregnancy, urine POC     Status: Abnormal   Collection Time: 09/08/16  5:35 PM  Result Value Ref Range   Preg Test, Ur POSITIVE (A) NEGATIVE  Wet prep, genital     Status: Abnormal   Collection Time: 09/08/16  6:00 PM  Result Value Ref Range   Yeast Wet Prep HPF POC NONE SEEN NONE SEEN   Trich, Wet Prep NONE SEEN NONE SEEN   Clue Cells Wet Prep HPF POC PRESENT (A) NONE SEEN   WBC, Wet Prep HPF POC MODERATE (A) NONE SEEN   Sperm NONE SEEN   CBC with Differential/Platelet     Status: Abnormal   Collection Time: 09/08/16  6:17 PM  Result Value Ref Range   WBC 5.4 4.0 - 10.5 K/uL   RBC 3.92 3.87 - 5.11 MIL/uL   Hemoglobin 12.2 12.0 - 15.0 g/dL   HCT 45.435.3 (L) 09.836.0 - 11.946.0 %   MCV 90.1 78.0 - 100.0 fL   MCH 31.1 26.0 - 34.0 pg   MCHC 34.6 30.0 - 36.0 g/dL   RDW 14.712.5 82.911.5 - 56.215.5 %   Platelets 331 150 - 400 K/uL   Neutrophils Relative % 53 %   Neutro Abs 2.8 1.7 - 7.7 K/uL   Lymphocytes Relative 39 %   Lymphs Abs 2.1 0.7 - 4.0 K/uL   Monocytes Relative 7 %   Monocytes Absolute 0.4 0.1 - 1.0 K/uL   Eosinophils Relative 1 %   Eosinophils Absolute 0.1 0.0 - 0.7 K/uL   Basophils Relative 0 %   Basophils Absolute 0.0 0.0 - 0.1 K/uL   [place] Ob Comp Less 14 Wks  Result Date: 09/08/2016 CLINICAL DATA:  Abdominal cramping low back pain and spotting EXAM: OBSTETRIC <14 WK US AND TRANSVAGINAL OB US TECHNIQUE: Both transabdominal and transvaginal ultrasound examinations were performed for complete evaluation of the gestation as well as the maternal uterus, adnexal regions, and pelvic cul-de-sac. Transvaginal technique was performed to assess early pregnancy. COMPARISON:  None. FINDINGS: Intrauterine gestational sac: Visualized Yolk sac:  Visualized Embryo:  Visualized Cardiac Activity: Not visualized CRL:  2  mm   5 w   5 d                  US EDC: 05/06/2017 Subchorionic hemorrhage: Small subchorionic hemorrhage along the left inferior  aspect of the sac. Maternal uterus/adnexae: Left ovary nonvisualized. Right ovary measures 4.7 x 2.7 by 3.5 cm. Two adjacent cysts measuring 3.6 cm in aggregate. No significant free fluid. IMPRESSION: Single intrauterine gestation. An embryo is visualized however fetal cardiac activity is not visualized. Findings are suspicious but not yet definitive for failed pregnancy. Recommend follow-up US in 10-14 days for definitive diagnosis. This recommendation  follows SRU consensus guidelines: Diagnostic Criteria for Nonviable Pregnancy Early in the First Trimester. Malva Limes Med 2013; 161:0960-45. Small subchorionic hemorrhage. Electronically Signed   By: Jasmine Pang M.D.   On: 09/08/2016 19:20   US Ob Transvaginal  Result Date: 09/08/2016 CLINICAL DATA:  Abdominal cramping low back pain and spotting EXAM: OBSTETRIC <14 WK [place] AND TRANSVAGINAL OB US TECHNIQUE: Both transabdominal and transvaginal ultrasound examinations were performed for complete evaluation of the gestation as well as the maternal uterus, adnexal regions, and pelvic cul-de-sac. Transvaginal technique was performed to assess early pregnancy. COMPARISON:  None. FINDINGS: Intrauterine gestational sac: Visualized Yolk sac:  Visualized Embryo:  Visualized Cardiac Activity: Not visualized CRL:  2  mm   5 w   5 d                  [place] EDC: 05/06/2017 Subchorionic hemorrhage: Small subchorionic hemorrhage along the left inferior aspect of the sac. Maternal uterus/adnexae: Left ovary nonvisualized. Right ovary measures 4.7 x 2.7 by 3.5 cm. Two adjacent cysts measuring 3.6 cm in aggregate. No significant free fluid. IMPRESSION: Single intrauterine gestation. An embryo is visualized however fetal cardiac activity is not visualized. Findings are suspicious but not yet definitive for failed pregnancy. Recommend follow-up US in 10-14 days for definitive diagnosis. This recommendation follows SRU consensus guidelines: Diagnostic Criteria for Nonviable Pregnancy Early  in the First Trimester. Malva Limes Med 2013; 409:8119-14. Small subchorionic hemorrhage. Electronically Signed   By: Jasmine Pang M.D.   On: 09/08/2016 19:20    MAU Course  Procedures None  MDM +UPT UA, wet prep, GC/chlamydia, CBC, quant hCG, HIV, RPR and [place] today to rule out ectopic pregnancy Discussed [place] results with patient. Dating is not consistent with LMP, but this was first LMP since stopping birth control. Will follow-up with [place] in 14 days to confirm viability prior to starting prenatal care.  Assessment and Plan  A: IUGS and YS at [redacted]w[redacted]d Small subchorionic hemorrhage Bacterial vaginosis   P: Discharge home Rx for Flagyl given to patient  Outpatient [place] ordered for 2 weeks, then follow-up for results in the [facility] afterwards Bleeding precautions discussed Patient may return to [facility]U as needed or if her condition were to change or worsen   Marny Lowenstein, PA-C  09/08/2016, 7:43 PM

## 2016-09-09 LAB — GC/CHLAMYDIA PROBE AMP (~~LOC~~) NOT AT ARMC
CHLAMYDIA, DNA PROBE: NEGATIVE
NEISSERIA GONORRHEA: NEGATIVE

## 2016-09-09 LAB — HIV ANTIBODY (ROUTINE TESTING W REFLEX): HIV Screen 4th Generation wRfx: NONREACTIVE

## 2016-09-09 LAB — RPR: RPR Ser Ql: NONREACTIVE

## 2016-09-18 ENCOUNTER — Ambulatory Visit (INDEPENDENT_AMBULATORY_CARE_PROVIDER_SITE_OTHER): Payer: PRIVATE HEALTH INSURANCE | Admitting: Obstetrics & Gynecology

## 2016-09-18 ENCOUNTER — Ambulatory Visit (HOSPITAL_COMMUNITY)
Admission: RE | Admit: 2016-09-18 | Discharge: 2016-09-18 | Disposition: A | Discharge status: Home / Self Care | Payer: PRIVATE HEALTH INSURANCE | Source: Ambulatory Visit | Attending: Medical | Admitting: Medical

## 2016-09-18 DIAGNOSIS — O208 Other hemorrhage in early pregnancy: Secondary | ICD-10-CM | POA: Diagnosis not present

## 2016-09-18 DIAGNOSIS — O26891 Other specified pregnancy related conditions, first trimester: Secondary | ICD-10-CM | POA: Insufficient documentation

## 2016-09-18 DIAGNOSIS — O039 Complete or unspecified spontaneous abortion without complication: Secondary | ICD-10-CM

## 2016-09-18 DIAGNOSIS — Z3A01 Less than 8 weeks gestation of pregnancy: Secondary | ICD-10-CM | POA: Insufficient documentation

## 2016-09-18 DIAGNOSIS — N854 Malposition of uterus: Secondary | ICD-10-CM | POA: Insufficient documentation

## 2016-09-18 DIAGNOSIS — O34531 Maternal care for retroversion of gravid uterus, first trimester: Secondary | ICD-10-CM | POA: Insufficient documentation

## 2016-09-18 DIAGNOSIS — R109 Unspecified abdominal pain: Secondary | ICD-10-CM | POA: Insufficient documentation

## 2016-09-18 DIAGNOSIS — O468X1 Other antepartum hemorrhage, first trimester: Secondary | ICD-10-CM

## 2016-09-18 DIAGNOSIS — O26899 Other specified pregnancy related conditions, unspecified trimester: Secondary | ICD-10-CM

## 2016-09-18 DIAGNOSIS — O418X10 Other specified disorders of amniotic fluid and membranes, first trimester, not applicable or unspecified: Secondary | ICD-10-CM | POA: Insufficient documentation

## 2016-09-18 NOTE — Progress Notes (Signed)
   Subjective:    Patient ID: Nancy Miles, female    DOB: 09/03/1975, 41 y.o.   MRN: [id number]  HPI  41 yo M P3 here to discuss her u/s results which show a spontaneous miscarriage. She has stopped bleeding.  She does not want any more kids. Review of Systems O+    Objective:   Physical Exam [id number] Breathing, conversing, and ambulating normally       Assessment & Plan:  Spontaneous miscarriage- reassurance given Rec get IUD at the health dept although [facility]

## 2016-09-18 NOTE — Patient Instructions (Addendum)
Natural Family Planning Introduction Natural Family Planning (NFP) is a type of birth control without using any form of contraception. Women who use NFP should not have sexual intercourse when the ovary produces an egg (ovulation) during the menstrual cycle. The NFP method is safe and can prevent pregnancy. It is 75% effective when practiced right. The man needs to also understand this method of birth control and the woman needs to be aware of how her body functions during her menstrual cycle. NFP can also be used as a method of getting pregnant. HOW THE NFP METHOD WORKS  A woman's menstrual period usually happens every 28-30 days (it can vary from 23-35 days).  Ovulation happens 12-14 days before the start of the next menstrual period (the fertile period). The egg is fertile for 24 hours and the sperm can live for 3 days or more. If there is sexual intercourse at this time, pregnancy can occur. THERE ARE MANY TYPES OF NFP METHODS USED TO PREVENT PREGNANCY  The basal body temperature method. Often times, there is a slight increase of body temperature when a woman ovulates. Take your temperature every morning before getting out of bed. Write the temperature on a chart. An increase in the temperature shows ovulation has happened. Do not have sexual intercourse from the menstrual period up to three days after the increase in the temperature. Note that the body temperature may increase as a result of fever, restless sleep, and working schedules.  The ovulation cervical mucus method. During the menstrual cycle, the cervical mucus changes from dry and sticky to wet and slippery. Check the mucus of the vagina every day to look for these changes. Just before ovulation, the mucus becomes wet and slippery. On the last day of wetness, ovulation happens. To avoid getting pregnant, sexual intercourse is safe for about 10 days after the menstrual period and on the dry mucus days. Do not have sexual intercourse when  the mucus starts to show up and not until 4 days after the wet and slippery mucus goes away. Sexual intercourse after the 4 days have passed until the menstrual period starts is a safe time. Note that the mucus from the vagina can increase because of a vaginal or cervical infection, lubricants, some medicines, and sexual excitement.  The symptothermal method. This method uses both the temperature and the ovulation methods. Combine the two methods above to prevent pregnancy.  The calendar method. Record your menstrual periods and length of the cycles for 6 months. This is helpful when the menstrual cycle varies in the length of the cycle. The length of a menstrual cycle is from day 1 of the present menstrual period to day 1 of the next menstrual period. Then, find your fertile days of the month and do not have sexual intercourse during that time. You may need help from your health care provider to find out your fertile days. There are some signs of ovulation that may be helpful when trying to find the time of ovulation. This includes vaginal spotting or abdominal cramps during the middle of your menstrual cycle. Not all women have these symptoms. YOU SHOULD NOT USE NFP IF:  You have very irregular menstrual periods and may skip months.  You have abnormal bleeding.  You have a vaginal or cervical infection.  You are on medicines that can affect the vaginal mucus or body temperature. These medicines include antibiotics, thyroid medicines, and antihistamines (cold and allergy medicine). This information is not intended to replace advice given   to you by your health care provider. Make sure you discuss any questions you have with your health care provider. Document Released: 11/26/2007 Document Revised: 11/15/2015 Document Reviewed: 12/10/2012 Elsevier Interactive Patient Education  2017 [employer] Levonorgestrel intrauterine device (IUD) What is this medicine? LEVONORGESTREL IUD (LEE voe nor jes  trel) is a contraceptive (birth control) device. The device is placed inside the uterus by a healthcare professional. It is used to prevent pregnancy. This device can also be used to treat heavy bleeding that occurs during your period. This medicine may be used for other purposes; ask your health care provider or pharmacist if you have questions. COMMON BRAND NAME(S): Cameron AliKyleena, LILETTA, Mirena, Skyla What should I tell my health care provider before I take this medicine? They need to know if you have any of these conditions: -abnormal Pap smear -cancer of the breast, uterus, or cervix -diabetes -endometritis -genital or pelvic infection now or in the past -have more than one sexual partner or your partner has more than one partner -heart disease -history of an ectopic or tubal pregnancy -immune system problems -IUD in place -liver disease or tumor -problems with blood clots or take blood-thinners -seizures -use intravenous drugs -uterus of unusual shape -vaginal bleeding that has not been explained -an unusual or allergic reaction to levonorgestrel, other hormones, silicone, or polyethylene, medicines, foods, dyes, or preservatives -pregnant or trying to get pregnant -breast-feeding How should I use this medicine? This device is placed inside the uterus by a health care professional. Talk to your pediatrician regarding the use of this medicine in children. Special care may be needed. Overdosage: If you think you have taken too much of this medicine contact a poison control center or emergency room at once. NOTE: This medicine is only for you. Do not share this medicine with others. What if I miss a dose? This does not apply. Depending on the brand of device you have inserted, the device will need to be replaced every 3 to 5 years if you wish to continue using this type of birth control. What may interact with this medicine? Do not take this medicine with any of the following  medications: -amprenavir -bosentan -fosamprenavir This medicine may also interact with the following medications: -aprepitant -armodafinil -barbiturate medicines for inducing sleep or treating seizures -bexarotene -boceprevir -griseofulvin -medicines to treat seizures like carbamazepine, ethotoin, felbamate, oxcarbazepine, phenytoin, topiramate -modafinil -pioglitazone -rifabutin -rifampin -rifapentine -some medicines to treat HIV infection like atazanavir, efavirenz, indinavir, lopinavir, nelfinavir, tipranavir, ritonavir -St. John's wort -warfarin This list may not describe all possible interactions. Give your health care provider a list of all the medicines, herbs, non-prescription drugs, or dietary supplements you use. Also tell them if you smoke, drink alcohol, or use illegal drugs. Some items may interact with your medicine. What should I watch for while using this medicine? Visit your doctor or health care professional for regular check ups. See your doctor if you or your partner has sexual contact with others, becomes HIV positive, or gets a sexual transmitted disease. This product does not protect you against HIV infection (AIDS) or other sexually transmitted diseases. You can check the placement of the IUD yourself by reaching up to the top of your vagina with clean fingers to feel the threads. Do not pull on the threads. It is a good habit to check placement after each menstrual period. Call your doctor right away if you feel more of the IUD than just the threads or if you cannot feel the threads  at all. The IUD may come out by itself. You may become pregnant if the device comes out. If you notice that the IUD has come out use a backup birth control method like condoms and call your health care provider. Using tampons will not change the position of the IUD and are okay to use during your period. This IUD can be safely scanned with magnetic resonance imaging (MRI) only under  specific conditions. Before you have an MRI, tell your healthcare provider that you have an IUD in place, and which type of IUD you have in place. What side effects may I notice from receiving this medicine? Side effects that you should report to your doctor or health care professional as soon as possible: -allergic reactions like skin rash, itching or hives, swelling of the face, lips, or tongue -fever, flu-like symptoms -genital sores -high blood pressure -no menstrual period for 6 weeks during use -pain, swelling, warmth in the leg -pelvic pain or tenderness -severe or sudden headache -signs of pregnancy -stomach cramping -sudden shortness of breath -trouble with balance, talking, or walking -unusual vaginal bleeding, discharge -yellowing of the eyes or skin Side effects that usually do not require medical attention (report to your doctor or health care professional if they continue or are bothersome): -acne -breast pain -change in sex drive or performance -changes in weight -cramping, dizziness, or faintness while the device is being inserted -headache -irregular menstrual bleeding within first 3 to 6 months of use -nausea This list may not describe all possible side effects. Call your doctor for medical advice about side effects. You may report side effects to FDA at 1-800-FDA-1088. Where should I keep my medicine? This does not apply. NOTE: This sheet is a summary. It may not cover all possible information. If you have questions about this medicine, talk to your doctor, pharmacist, or health care provider.  2018 Elsevier/Gold Standard (2016-03-21 14:14:56)

## 2017-01-28 ENCOUNTER — Encounter (HOSPITAL_COMMUNITY): Payer: Self-pay | Admitting: Family Medicine

## 2017-01-28 ENCOUNTER — Ambulatory Visit (HOSPITAL_COMMUNITY)
Admission: EM | Admit: 2017-01-28 | Discharge: 2017-01-28 | Disposition: A | Discharge status: Home / Self Care | Payer: PRIVATE HEALTH INSURANCE | Attending: Family Medicine | Admitting: Family Medicine

## 2017-01-28 DIAGNOSIS — B349 Viral infection, unspecified: Secondary | ICD-10-CM | POA: Diagnosis not present

## 2017-01-28 DIAGNOSIS — R52 Pain, unspecified: Secondary | ICD-10-CM

## 2017-01-28 MED ORDER — DICLOFENAC SODIUM 75 MG PO TBEC: 75.0000 mg | DELAYED_RELEASE_TABLET | Freq: Two times a day (BID) | ORAL | 0 refills | Status: DC

## 2017-01-28 NOTE — ED Triage Notes (Signed)
Pt here for generalized body aches, fever and HA.

## 2017-01-31 NOTE — ED Provider Notes (Signed)
  [facility]   [id number] 01/28/17 Arrival Time: 1756  ASSESSMENT & PLAN:  1. Viral illness   2. Body aches     Meds ordered this encounter  Medications  . diclofenac (VOLTAREN) 75 MG EC tablet    Sig: Take 1 tablet (75 mg total) by mouth 2 (two) times daily.    Dispense:  14 tablet    Refill:  0   OTC symptom care discussed. Will f/u if not showing improvement within the next 3-4 days, sooner if needed or worsening. Ensure adequate fluid intake.  Reviewed expectations re: course of current medical issues. Questions answered. Outlined signs and symptoms indicating need for more acute intervention. Patient verbalized understanding. After Visit Summary given.   SUBJECTIVE:  Boneta LucksGhenet Mendia is a 41 y.o. female who presents with complaint of generalized body aches, subjective fever, and mild headache. Fairly acute onset yesterday. Overall fatigued. Mild nasal congestion and dry cough. No sick contacts or recent travel reported. No SOB or wheezing. Tylenol with mild help. No n/v. Tolerating normal PO intake. No rashes or abdominal pain. No known tick exposure.  ROS: As per HPI.   OBJECTIVE:  Vitals:   01/28/17 1817  BP: 99/66  Pulse: (!) 101  Resp: 18  Temp: 98.5 F (36.9 C)  SpO2: 100%     General appearance: alert; no distress HEENT: nasal congestion; TMs normal Neck: supple Lungs: clear to auscultation bilaterally; dry cough without wheezing Extremities: no cyanosis or edema Skin: warm and dry Psychological:  alert and cooperative; normal mood and affect   No Known Allergies  PMHx, SurgHx, SocialHx, Medications, and Allergies were reviewed in the Visit Navigator and updated as appropriate.      Mardella LaymanHagler, Kylor Valverde, MD 01/31/17 1135

## 2018-01-25 IMAGING — US US OB TRANSVAGINAL
1 series · 15 of 28 positions shown · non-contrast
Comparison: 09/08/2016 obstetric scan.

CLINICAL DATA: 40-year-old pregnant female presenting for follow-up
assessment of fetal viability. Patient reports decreased vaginal
bleeding.

EDC by initial sonogram: 05/06/2017, projecting to an expected
gestational age of 7 weeks 1 day.
EXAM:
TRANSVAGINAL OB ULTRASOUND
TECHNIQUE: Transvaginal ultrasound was performed for complete evaluation of the
gestation as well as the maternal uterus, adnexal regions, and
pelvic cul-de-sac.

[Series 1: us ob transvaginal · 15 of 38 slices shown]
[im 1/38]
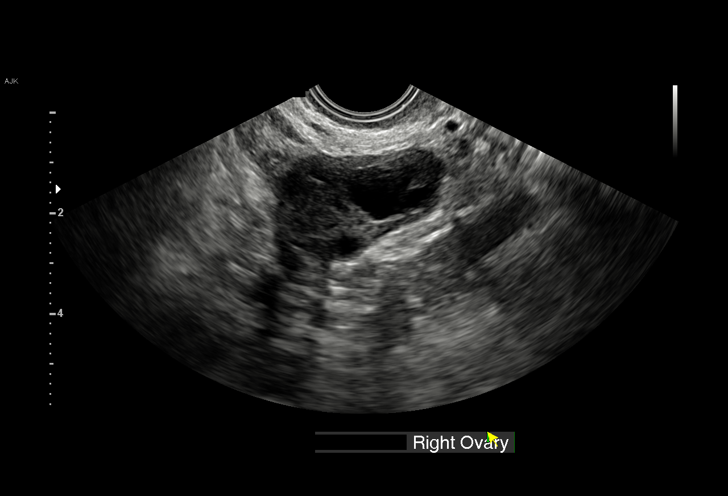
[im 3/38]
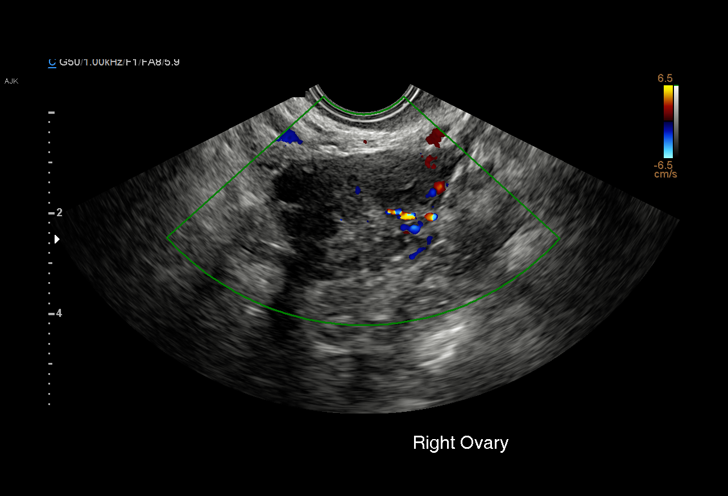
[im 6/38]
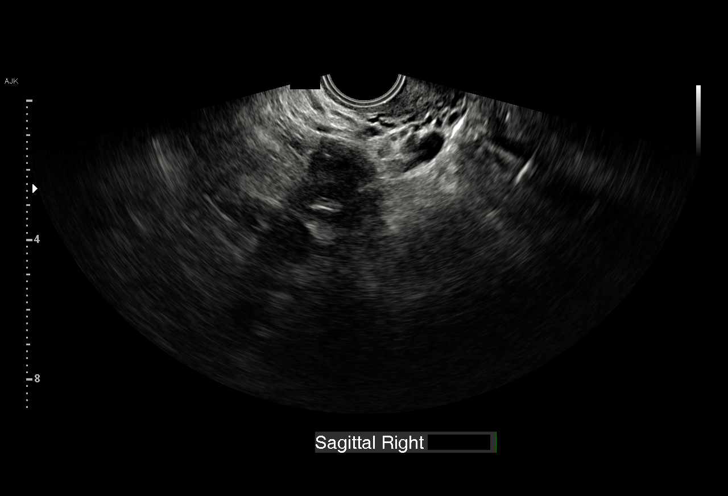
[im 9/38]
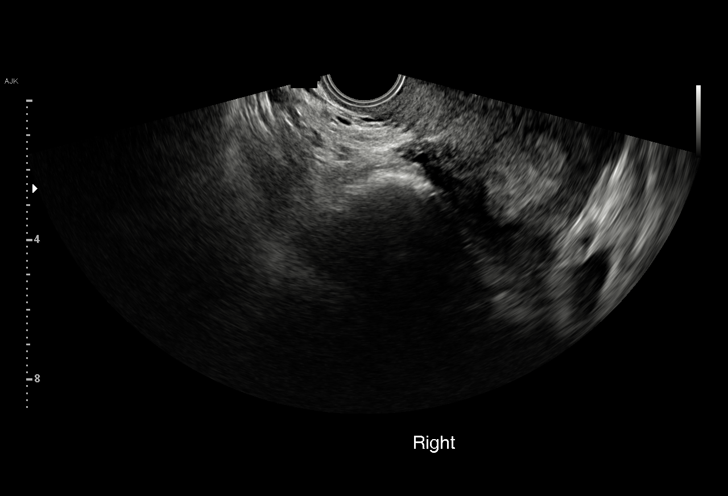
[im 11/38]
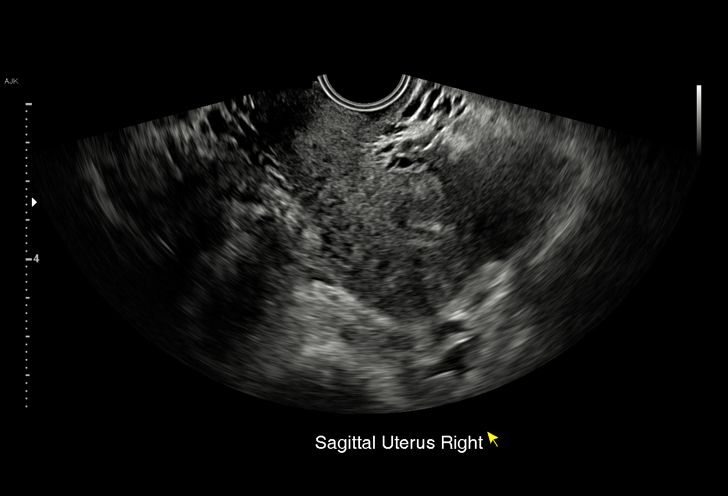
[im 14/38]
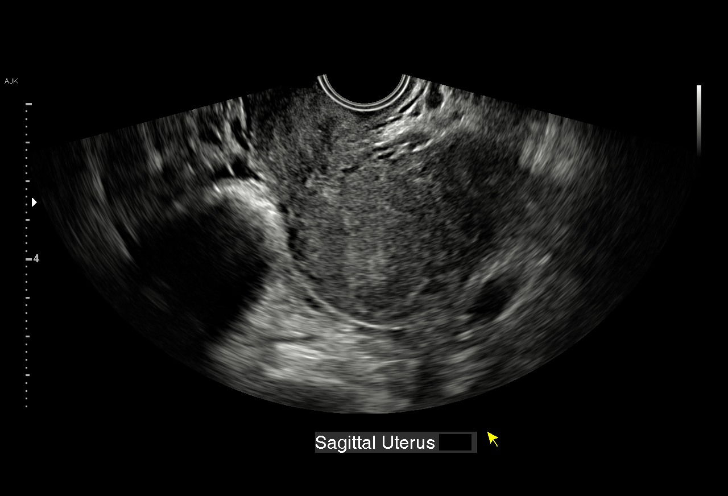
[im 17/38]
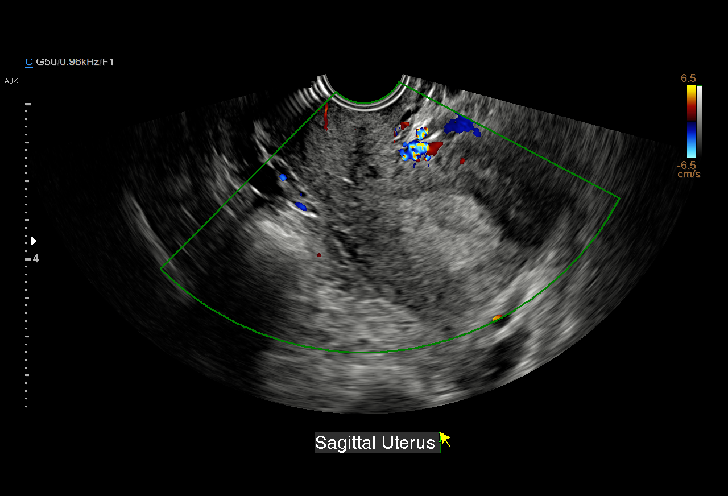
[im 20/38]
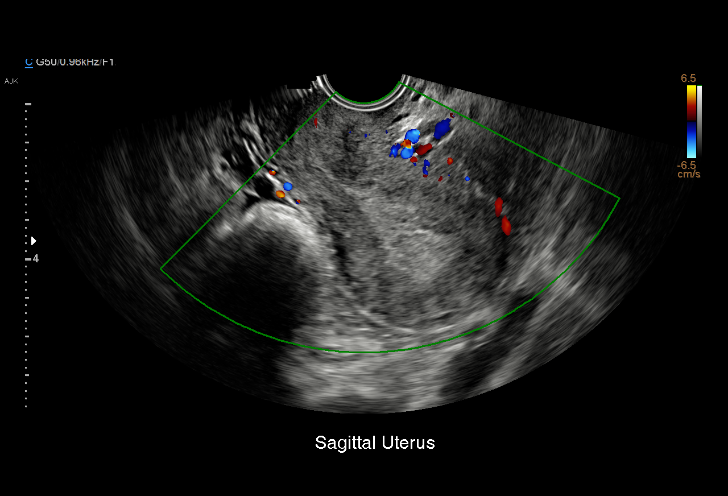
[im 21/38]
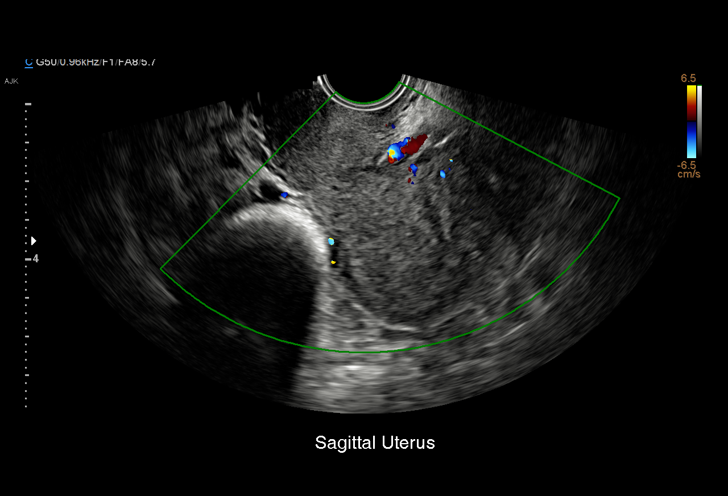
[im 24/38]
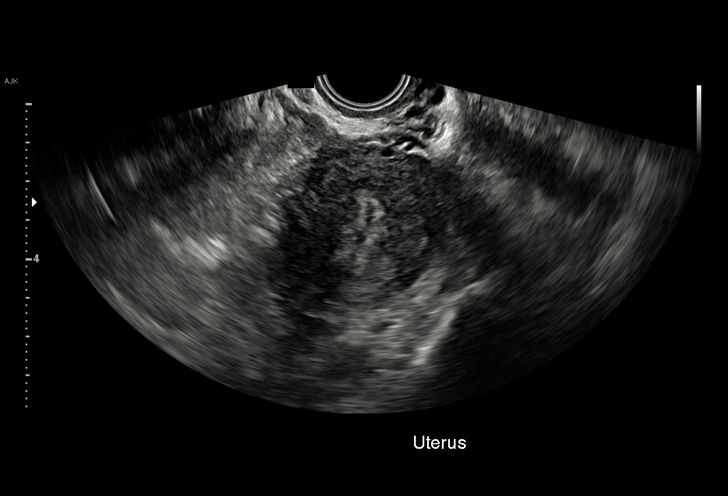
[im 27/38]
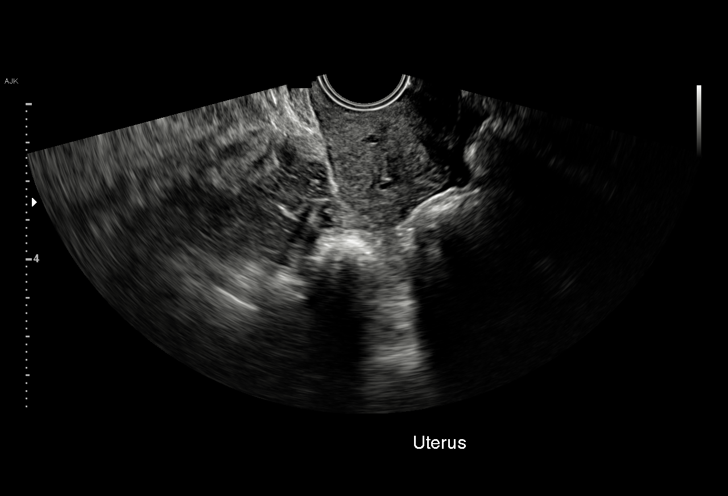
[im 29/38]
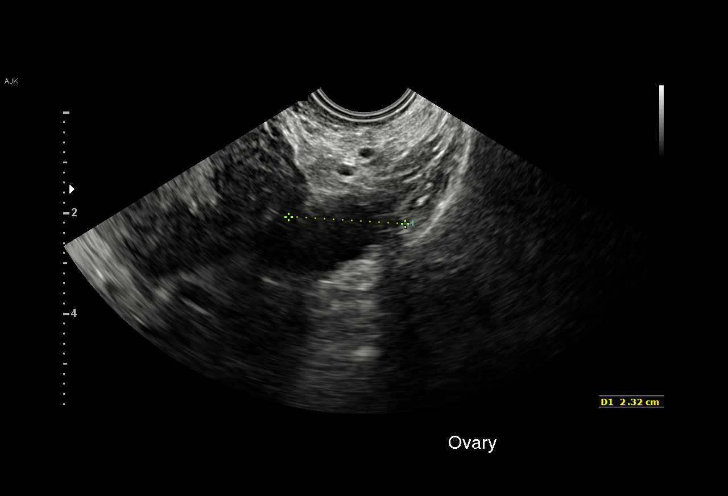
[im 32/38]
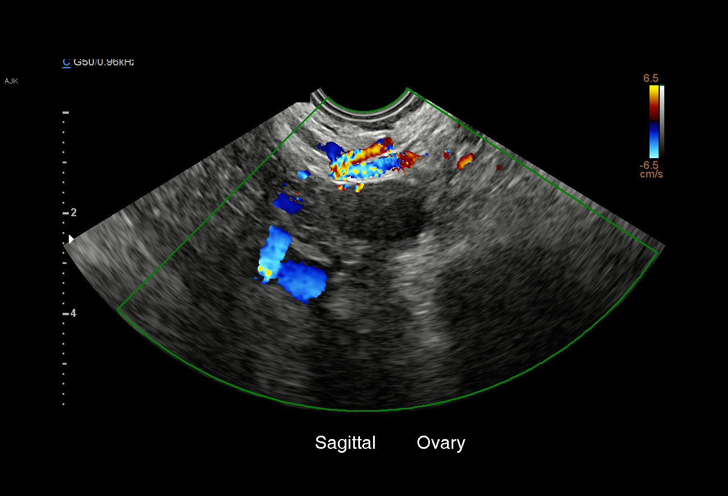
[im 35/38]
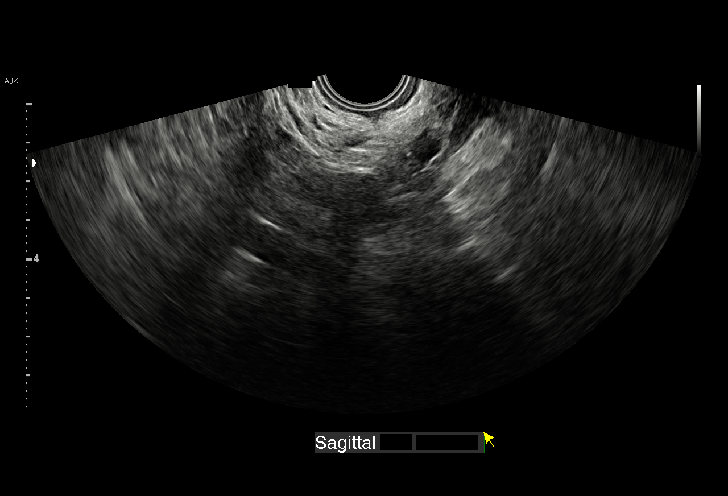
[im 38/38]
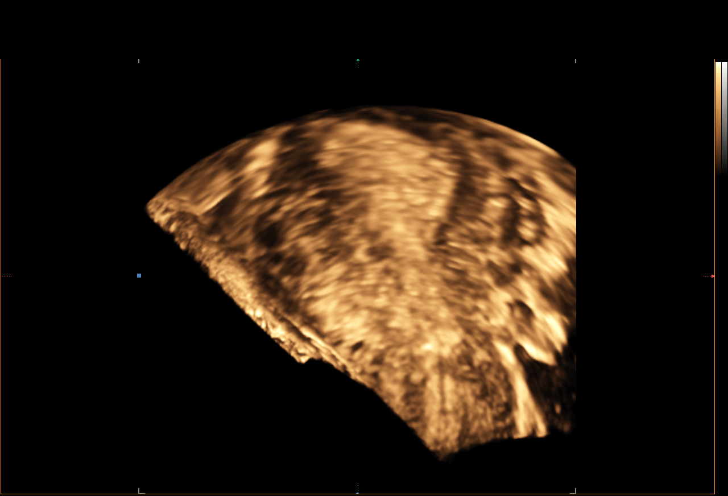

[15 of 28 positions shown; findings below may reference images not displayed]

FINDINGS: Uterus is retroverted. No uterine fibroids or other myometrial
abnormalities. Bilayer endometrial thickness 8 mm. Mildly
heterogeneous endometrium. No intrauterine gestational sac.
Previously visualized intrauterine gestational sac is absent on
today's scan. No endometrial vascularity on color Doppler scan.

Right ovary measures 2.6 x 2.2 x 3.3 cm. Left ovary measures 3.3 x
1.5 x 2.3 cm. No suspicious ovarian or adnexal masses. No abnormal
free fluid in the pelvis.
IMPRESSION: 1. Previously visualized intrauterine gestational sac is absent on
today's scan. Bilayer endometrial thickness 8 mm with mildly
heterogeneous non-vascular endometrium. Findings are compatible with
spontaneous abortion.
2. No ovarian or adnexal abnormality.

## 2018-04-01 ENCOUNTER — Other Ambulatory Visit: Payer: Self-pay | Admitting: Internal Medicine

## 2018-04-01 DIAGNOSIS — Z1231 Encounter for screening mammogram for malignant neoplasm of breast: Secondary | ICD-10-CM

## 2018-04-11 ENCOUNTER — Inpatient Hospital Stay (HOSPITAL_COMMUNITY)
Admission: AD | Admit: 2018-04-11 | Discharge: 2018-04-11 | Disposition: A | Discharge status: Home / Self Care | Payer: Managed Care, Other (non HMO) | Source: Ambulatory Visit | Attending: Obstetrics & Gynecology | Admitting: Obstetrics & Gynecology

## 2018-04-11 ENCOUNTER — Ambulatory Visit (HOSPITAL_COMMUNITY)
Admission: EM | Admit: 2018-04-11 | Discharge: 2018-04-11 | Disposition: A | Discharge status: Nursing Home (Includes ICF) | Payer: Managed Care, Other (non HMO) | Source: Home / Self Care

## 2018-04-11 ENCOUNTER — Encounter (HOSPITAL_COMMUNITY): Payer: Self-pay | Admitting: *Deleted

## 2018-04-11 ENCOUNTER — Other Ambulatory Visit: Payer: Self-pay

## 2018-04-11 ENCOUNTER — Encounter (HOSPITAL_COMMUNITY): Payer: Self-pay

## 2018-04-11 DIAGNOSIS — O210 Mild hyperemesis gravidarum: Secondary | ICD-10-CM | POA: Insufficient documentation

## 2018-04-11 DIAGNOSIS — Z3A01 Less than 8 weeks gestation of pregnancy: Secondary | ICD-10-CM | POA: Insufficient documentation

## 2018-04-11 DIAGNOSIS — R109 Unspecified abdominal pain: Secondary | ICD-10-CM | POA: Diagnosis not present

## 2018-04-11 DIAGNOSIS — R1084 Generalized abdominal pain: Secondary | ICD-10-CM | POA: Diagnosis not present

## 2018-04-11 DIAGNOSIS — Z3201 Encounter for pregnancy test, result positive: Secondary | ICD-10-CM | POA: Diagnosis not present

## 2018-04-11 DIAGNOSIS — O26891 Other specified pregnancy related conditions, first trimester: Secondary | ICD-10-CM | POA: Insufficient documentation

## 2018-04-11 DIAGNOSIS — Z3A08 8 weeks gestation of pregnancy: Secondary | ICD-10-CM

## 2018-04-11 DIAGNOSIS — R112 Nausea with vomiting, unspecified: Secondary | ICD-10-CM

## 2018-04-11 LAB — CBC
HEMATOCRIT: 33.5 % — AB (ref 36.0–46.0)
Hemoglobin: 11.1 g/dL — ABNORMAL LOW (ref 12.0–15.0)
MCH: 27.3 pg (ref 26.0–34.0)
MCHC: 33.1 g/dL (ref 30.0–36.0)
MCV: 82.5 fL (ref 80.0–100.0)
Platelets: 371 10*3/uL (ref 150–400)
RBC: 4.06 MIL/uL (ref 3.87–5.11)
RDW: 14.5 % (ref 11.5–15.5)
WBC: 5.4 10*3/uL (ref 4.0–10.5)
nRBC: 0 % (ref 0.0–0.2)

## 2018-04-11 LAB — URINALYSIS, ROUTINE W REFLEX MICROSCOPIC
Bilirubin Urine: NEGATIVE
Glucose, UA: NEGATIVE mg/dL
HGB URINE DIPSTICK: NEGATIVE
Ketones, ur: 5 mg/dL — AB
NITRITE: NEGATIVE
PROTEIN: NEGATIVE mg/dL
SPECIFIC GRAVITY, URINE: 1.027 (ref 1.005–1.030)
pH: 5 (ref 5.0–8.0)

## 2018-04-11 LAB — COMPREHENSIVE METABOLIC PANEL
ALBUMIN: 3.9 g/dL (ref 3.5–5.0)
ALT: 14 U/L (ref 0–44)
ANION GAP: 9 (ref 5–15)
AST: 16 U/L (ref 15–41)
Alkaline Phosphatase: 66 U/L (ref 38–126)
BILIRUBIN TOTAL: 1 mg/dL (ref 0.3–1.2)
BUN: 11 mg/dL (ref 6–20)
CHLORIDE: 104 mmol/L (ref 98–111)
CO2: 22 mmol/L (ref 22–32)
Calcium: 8.9 mg/dL (ref 8.9–10.3)
Creatinine, Ser: 0.67 mg/dL (ref 0.44–1.00)
GFR calc Af Amer: >60 mL/min (ref >60)
GLUCOSE: 94 mg/dL (ref 70–99)
Potassium: 3.6 mmol/L (ref 3.5–5.1)
Sodium: 135 mmol/L (ref 135–145)
TOTAL PROTEIN: 6.7 g/dL (ref 6.5–8.1)

## 2018-04-11 LAB — POCT PREGNANCY, URINE: Preg Test, Ur: POSITIVE — AB

## 2018-04-11 LAB — HCG, QUANTITATIVE, PREGNANCY: HCG, BETA CHAIN, QUANT, S: 79194 m[IU]/mL — AB (ref <5)

## 2018-04-11 MED ORDER — GI COCKTAIL ~~LOC~~
30.0000 mL | Freq: Once | ORAL | Status: AC
  Administered 2018-04-11: 30 mL via ORAL
  Filled 2018-04-11: qty 30

## 2018-04-11 MED ORDER — ONDANSETRON 8 MG PO TBDP
8.0000 mg | ORAL_TABLET | Freq: Once | ORAL | Status: AC
  Administered 2018-04-11: 8 mg via ORAL
  Filled 2018-04-11: qty 1

## 2018-04-11 MED ORDER — FAMOTIDINE 20 MG PO TABS: 20.0000 mg | ORAL_TABLET | Freq: Two times a day (BID) | ORAL | Status: DC

## 2018-04-11 MED ORDER — ONDANSETRON 8 MG PO TBDP: 8.0000 mg | ORAL_TABLET | Freq: Two times a day (BID) | ORAL | Status: DC

## 2018-04-11 MED ORDER — ONDANSETRON 8 MG PO TBDP: 8.0000 mg | ORAL_TABLET | Freq: Three times a day (TID) | ORAL | 0 refills | Status: DC | PRN

## 2018-04-11 MED ORDER — FAMOTIDINE 20 MG PO TABS: 20.0000 mg | ORAL_TABLET | Freq: Two times a day (BID) | ORAL | 0 refills | Status: DC

## 2018-04-11 NOTE — Discharge Instructions (Signed)
Go immediately to [facility] [address] [place], [place]  [phone]

## 2018-04-11 NOTE — MAU Provider Note (Addendum)
Patient Nancy Miles is a 42 y.o.  [phone] At 100w0d here with complaints of weakness and heartburn, as well as nausea and vomiting. She also endorses abdominal pain near her chest when she swallows; she thinks it is from throwing up. Her LMP was September 13 or 15, 2019.   History     CSN: [id number]  Arrival date and time: 04/11/18 1137   First Provider Initiated Contact with Patient 04/11/18 1303      Chief Complaint  Patient presents with  . Abdominal Pain   Abdominal Pain  This is a new problem. The current episode started yesterday. The onset quality is sudden. The pain is located in the epigastric region. The pain is at a severity of 10/10. The quality of the pain is burning. Associated symptoms include vomiting. Nothing aggravates the pain. The pain is relieved by nothing.  Emesis   This is a new problem. The current episode started in the past 7 days. The problem occurs 2 to 4 times per day. There has been no fever. Associated symptoms include abdominal pain.    OB History    Gravida  6   Para  3   Term  3   Preterm      AB  2   Living  3     SAB  2   TAB      Ectopic      Multiple      Live Births  3           Past Medical History:  Diagnosis Date  . Headache     Past Surgical History:  Procedure Laterality Date  . CESAREAN SECTION     transverse lie    Family History  Problem Relation Age of Onset  . Healthy Mother   . Other Neg Hx     Social History   Tobacco Use  . Smoking status: Never Smoker  . Smokeless tobacco: Never Used  Substance Use Topics  . Alcohol use: No  . Drug use: No    Allergies: No Known Allergies  No medications prior to admission.    Review of Systems  Constitutional: Negative.   HENT: Negative.   Respiratory: Negative.   Gastrointestinal: Positive for abdominal pain and vomiting.       Epigastric soreness with vomiting  Genitourinary: Negative.   Musculoskeletal: Negative.   Skin: Negative.    Neurological: Negative.   Psychiatric/Behavioral: Negative.    Physical Exam   Blood pressure 110/76, pulse (!) 106, temperature 98.7 F (37.1 C), temperature source Oral, resp. rate 16, weight 64.9 kg, last menstrual period 03/07/2018.  Physical Exam  Constitutional: She is oriented to person, place, and time. She appears well-developed.  HENT:  Head: Normocephalic.  Eyes: Pupils are equal, round, and reactive to light.  Neck: Normal range of motion.  Respiratory: Effort normal.  GI: Soft.  Musculoskeletal: Normal range of motion.  Neurological: She is alert and oriented to person, place, and time.  Skin: Skin is warm and dry.  Psychiatric: She has a normal mood and affect.    MAU Course  Procedures  MDM -epigastric soreness is most likely cause of patient's upper GI pain.  -Tolerated antiemetic and Gi cocktail; feels much better.  -Tolerated ginger ale and ice; desires discharge.   Assessment and Plan   1. Morning sickness   2. Abdominal pain    2. Patient stable for discharge with instructions to take Zofran dissolving tablets 3 times a day, plus  pepcid twice a day.  Paper RX given.   3. Number for [facility] given and patient will establish care there in 5 weeks.   Charlesetta Garibaldi Joanie Duprey 04/11/2018, 1:03 PM

## 2018-04-11 NOTE — ED Triage Notes (Signed)
C/O epigastric pain since yesterday with belching and vomiting.  Denies diarrhea.  STates has felt feverish.

## 2018-04-11 NOTE — MAU Note (Signed)
Patient husband interpreting for her. Waiver for interpretative services obtained.

## 2018-04-11 NOTE — Discharge Instructions (Signed)

## 2018-04-11 NOTE — ED Provider Notes (Signed)
  [facility]    CSN: [id number] Arrival date & time: 04/11/18  1011     History   Chief Complaint Chief Complaint  Patient presents with  . Abdominal Pain  . Emesis   HPI Nancy Miles is a 42 y.o. female.  Patient presents today accompanied by her husband with a complaint of abdominal pain and persistent emesis.  She has been without a menstrual cycle since August.  In review of EMR she suffered from a miscarriage in March 2018. Abdominal pain is generalized.  Patient's urine pregnancy is positive.  Denies any vaginal bleeding.  Patient appears very ill and has been advised to go immediately to [facility] for further evaluation given her history of miscarriage and her advanced maternal age. She has been discharged with her spouse who advises he will drive her immediately to [facility] for further evaluation.  Final Clinical Impressions(s) / UC Diagnoses   Final diagnoses:  Generalized abdominal pain  Non-intractable vomiting with nausea, unspecified vomiting type  [redacted] weeks gestation of pregnancy     Discharge Instructions     Go immediately to [facility] [address] [place], [place]  [phone]   ED Prescriptions    None     Controlled Substance Prescriptions [place] Controlled Substance Registry consulted? Not Applicable   Bing Neighbors, FNP 04/11/18 1119

## 2018-04-11 NOTE — MAU Note (Signed)
Reports epigastric pain since yesterday, 10/10, pressure, constant  N/V but not diarrhea  No lower abdominal pain, no vaginal bleeding

## 2018-05-25 ENCOUNTER — Encounter: Payer: Managed Care, Other (non HMO) | Admitting: Obstetrics and Gynecology

## 2018-05-27 ENCOUNTER — Other Ambulatory Visit: Payer: Self-pay

## 2018-05-27 ENCOUNTER — Encounter (HOSPITAL_COMMUNITY): Payer: Self-pay | Admitting: *Deleted

## 2018-05-27 ENCOUNTER — Ambulatory Visit (HOSPITAL_COMMUNITY)
Admission: EM | Admit: 2018-05-27 | Discharge: 2018-05-27 | Disposition: A | Discharge status: Home / Self Care | Payer: Managed Care, Other (non HMO) | Source: Home / Self Care

## 2018-05-27 ENCOUNTER — Inpatient Hospital Stay (HOSPITAL_COMMUNITY)
Admission: AD | Admit: 2018-05-27 | Discharge: 2018-05-27 | Disposition: A | Discharge status: Home / Self Care | Payer: Managed Care, Other (non HMO) | Source: Ambulatory Visit | Attending: Obstetrics and Gynecology | Admitting: Obstetrics and Gynecology

## 2018-05-27 DIAGNOSIS — G43009 Migraine without aura, not intractable, without status migrainosus: Secondary | ICD-10-CM | POA: Insufficient documentation

## 2018-05-27 DIAGNOSIS — O210 Mild hyperemesis gravidarum: Secondary | ICD-10-CM | POA: Diagnosis present

## 2018-05-27 DIAGNOSIS — O26891 Other specified pregnancy related conditions, first trimester: Secondary | ICD-10-CM | POA: Insufficient documentation

## 2018-05-27 DIAGNOSIS — O219 Vomiting of pregnancy, unspecified: Secondary | ICD-10-CM

## 2018-05-27 DIAGNOSIS — Z3A11 11 weeks gestation of pregnancy: Secondary | ICD-10-CM | POA: Insufficient documentation

## 2018-05-27 LAB — COMPREHENSIVE METABOLIC PANEL
ALT: 26 U/L (ref 0–44)
AST: 27 U/L (ref 15–41)
Albumin: 3.7 g/dL (ref 3.5–5.0)
Alkaline Phosphatase: 60 U/L (ref 38–126)
Anion gap: 9 (ref 5–15)
BUN: <5 mg/dL — ABNORMAL LOW (ref 6–20)
CO2: 22 mmol/L (ref 22–32)
CREATININE: 0.48 mg/dL (ref 0.44–1.00)
Calcium: 9.3 mg/dL (ref 8.9–10.3)
Chloride: 103 mmol/L (ref 98–111)
GFR calc non Af Amer: >60 mL/min (ref >60)
Glucose, Bld: 94 mg/dL (ref 70–99)
Potassium: 4 mmol/L (ref 3.5–5.1)
Sodium: 134 mmol/L — ABNORMAL LOW (ref 135–145)
Total Bilirubin: 0.8 mg/dL (ref 0.3–1.2)
Total Protein: 6.7 g/dL (ref 6.5–8.1)

## 2018-05-27 LAB — URINALYSIS, ROUTINE W REFLEX MICROSCOPIC
BILIRUBIN URINE: NEGATIVE
GLUCOSE, UA: NEGATIVE mg/dL
HGB URINE DIPSTICK: NEGATIVE
Ketones, ur: 80 mg/dL — AB
LEUKOCYTES UA: NEGATIVE
NITRITE: NEGATIVE
Protein, ur: 30 mg/dL — AB
SPECIFIC GRAVITY, URINE: 1.024 (ref 1.005–1.030)
pH: 5 (ref 5.0–8.0)

## 2018-05-27 LAB — CBC
HCT: 33.7 % — ABNORMAL LOW (ref 36.0–46.0)
Hemoglobin: 11 g/dL — ABNORMAL LOW (ref 12.0–15.0)
MCH: 27.6 pg (ref 26.0–34.0)
MCHC: 32.6 g/dL (ref 30.0–36.0)
MCV: 84.5 fL (ref 80.0–100.0)
Platelets: 367 10*3/uL (ref 150–400)
RBC: 3.99 MIL/uL (ref 3.87–5.11)
RDW: 14.5 % (ref 11.5–15.5)
WBC: 4.6 10*3/uL (ref 4.0–10.5)
nRBC: 0 % (ref 0.0–0.2)

## 2018-05-27 LAB — TSH: TSH: 1.294 u[IU]/mL (ref 0.350–4.500)

## 2018-05-27 MED ORDER — DEXAMETHASONE SODIUM PHOSPHATE 10 MG/ML IJ SOLN
10.0000 mg | Freq: Once | INTRAMUSCULAR | Status: AC
  Administered 2018-05-27: 10 mg via INTRAVENOUS
  Filled 2018-05-27: qty 1

## 2018-05-27 MED ORDER — GLYCOPYRROLATE 2 MG PO TABS: 2.0000 mg | ORAL_TABLET | Freq: Three times a day (TID) | ORAL | 3 refills | Status: DC | PRN

## 2018-05-27 MED ORDER — GLYCOPYRROLATE 0.2 MG/ML IJ SOLN
0.1000 mg | Freq: Once | INTRAMUSCULAR | Status: AC
  Administered 2018-05-27: 0.1 mg via INTRAVENOUS
  Filled 2018-05-27: qty 0.5

## 2018-05-27 MED ORDER — PANTOPRAZOLE SODIUM 40 MG PO TBEC: 40.0000 mg | DELAYED_RELEASE_TABLET | Freq: Every day | ORAL | 2 refills | Status: DC

## 2018-05-27 MED ORDER — METOCLOPRAMIDE HCL 5 MG/ML IJ SOLN
10.0000 mg | Freq: Once | INTRAMUSCULAR | Status: AC
  Administered 2018-05-27: 10 mg via INTRAVENOUS
  Filled 2018-05-27: qty 2

## 2018-05-27 MED ORDER — DIPHENHYDRAMINE HCL 50 MG/ML IJ SOLN
25.0000 mg | Freq: Once | INTRAMUSCULAR | Status: AC
  Administered 2018-05-27: 25 mg via INTRAVENOUS
  Filled 2018-05-27: qty 1

## 2018-05-27 MED ORDER — METOCLOPRAMIDE HCL 10 MG PO TABS: 10.0000 mg | ORAL_TABLET | Freq: Four times a day (QID) | ORAL | 2 refills | Status: DC | PRN

## 2018-05-27 MED ORDER — LACTATED RINGERS IV BOLUS (SEPSIS)
1000.0000 mL | Freq: Once | INTRAVENOUS | Status: AC
Start: 2018-05-27 — End: 2018-05-27
  Administered 2018-05-27: 1000 mL via INTRAVENOUS

## 2018-05-27 MED ORDER — LACTATED RINGERS IV BOLUS
1000.0000 mL | Freq: Once | INTRAVENOUS | Status: AC
  Administered 2018-05-27: 1000 mL via INTRAVENOUS

## 2018-05-27 MED ORDER — ONDANSETRON 8 MG PO TBDP: 8.0000 mg | ORAL_TABLET | Freq: Three times a day (TID) | ORAL | 2 refills | Status: DC | PRN

## 2018-05-27 MED ORDER — FAMOTIDINE IN NACL 20-0.9 MG/50ML-% IV SOLN
20.0000 mg | Freq: Once | INTRAVENOUS | Status: AC
  Administered 2018-05-27: 20 mg via INTRAVENOUS
  Filled 2018-05-27: qty 50

## 2018-05-27 NOTE — Progress Notes (Signed)
Electronic signature pad not working in room 4.  Patient signed paper copy of AVS form and discharge home in good condition.  Pt denies having any questions and verbalizes understanding of dc instructions.

## 2018-05-27 NOTE — ED Notes (Signed)
Confirmed there is a positive pregnancy testing in system and has an extreme headache.  Dr Delton Seenelson aware, standard of work statement [facility]  Patient has a friend with her and states understanding of instruction

## 2018-05-27 NOTE — MAU Provider Note (Signed)
Chief Complaint: Emesis and Headache   First Provider Initiated Contact with Patient 05/27/18 1326      SUBJECTIVE HPI: Nancy Miles is a 42 y.o. [id number] at 857w4d by LMP who presents to maternity admissions reporting ongoing n/v in pregnancy with onset of severe headache today. Her h/a is constant pressure pain more on the right frontal area than left. It is associated with light sensitivity.  Her n/v is unchanged since earlier in pregnancy.  She is taking Zofran PRN at home but it is not helping. She has upper abdominal/epigastric pain related to vomiting.  This is not new and was present on previous MAU visit as well. There are no other symptoms. She has not tried any other treatments.     HPI  Past Medical History:  Diagnosis Date  . Headache    Past Surgical History:  Procedure Laterality Date  . CESAREAN SECTION     transverse lie   Social History   Socioeconomic History  . Marital status: Married    Spouse Miles: Not on file  . Number of children: Not on file  . Years of education: Not on file  . Highest education level: Not on file  Occupational History  . Not on file  Social Needs  . Financial resource strain: Not on file  . Food insecurity:    Worry: Not on file    Inability: Not on file  . Transportation needs:    Medical: Not on file    Non-medical: Not on file  Tobacco Use  . Smoking status: Never Smoker  . Smokeless tobacco: Never Used  Substance and Sexual Activity  . Alcohol use: No  . Drug use: No  . Sexual activity: Not on file  Lifestyle  . Physical activity:    Days per week: Not on file    Minutes per session: Not on file  . Stress: Not on file  Relationships  . Social connections:    Talks on phone: Not on file    Gets together: Not on file    Attends religious service: Not on file    Active member of club or organization: Not on file    Attends meetings of clubs or organizations: Not on file    Relationship status: Not on file  . Intimate  partner violence:    Fear of current or ex partner: Not on file    Emotionally abused: Not on file    Physically abused: Not on file    Forced sexual activity: Not on file  Other Topics Concern  . Not on file  Social History Narrative  . Not on file   No current facility-administered medications on file prior to encounter.    Current Outpatient Medications on File Prior to Encounter  Medication Sig Dispense Refill  . famotidine (PEPCID) 20 MG tablet Take 1 tablet (20 mg total) by mouth 2 (two) times daily. 60 tablet 0  . ondansetron (ZOFRAN-ODT) 8 MG disintegrating tablet Take 1 tablet (8 mg total) by mouth every 8 (eight) hours as needed for nausea or vomiting. 90 tablet 0   No Known Allergies  ROS:  Review of Systems  Constitutional: Negative for chills, fatigue and fever.  Eyes: Positive for photophobia.  Respiratory: Negative for shortness of breath.   Cardiovascular: Negative for chest pain.  Gastrointestinal: Positive for abdominal pain, nausea and vomiting.  Genitourinary: Negative for difficulty urinating, dysuria, flank pain, pelvic pain, vaginal bleeding, vaginal discharge and vaginal pain.  Neurological: Positive for headaches.  Negative for dizziness.  Psychiatric/Behavioral: Negative.      I have reviewed patient's Past Medical Hx, Surgical Hx, Family Hx, Social Hx, medications and allergies.   Physical Exam   Patient Vitals for the past 24 hrs:  BP Temp Temp src Pulse Resp SpO2 Height Weight  05/27/18 1240 120/77 98.3 F (36.8 C) Oral (!) 104 18 100 % - -  05/27/18 1236 - - - - - - 5\' 2"  (1.575 m) 60 kg   Constitutional: Well-developed, well-nourished female in no acute distress.  HEART: normal rate, heart sounds, regular rhythm RESP: normal effort, lung sounds clear and equal bilaterally GI: Abd soft, non-tender. Pos BS x 4 MS: Extremities nontender, no edema, normal ROM Neurological - alert, oriented, normal speech, no focal findings or movement disorder  noted, screening mental status exam normal, cranial nerves II through XII intact, DTR's normal and symmetric, motor and sensory grossly normal bilaterally, normal muscle tone, no tremors, strength 5/5 GU: Neg CVAT.   FHT 166 by doppler  LAB RESULTS Results for orders placed or performed during the hospital encounter of 05/27/18 (from the past 24 hour(s))  Urinalysis, Routine w reflex microscopic     Status: Abnormal   Collection Time: 05/27/18 12:52 PM  Result Value Ref Range   Color, Urine AMBER (A) YELLOW   APPearance HAZY (A) CLEAR   Specific Gravity, Urine 1.024 1.005 - 1.030   pH 5.0 5.0 - 8.0   Glucose, UA NEGATIVE NEGATIVE mg/dL   Hgb urine dipstick NEGATIVE NEGATIVE   Bilirubin Urine NEGATIVE NEGATIVE   Ketones, ur 80 (A) NEGATIVE mg/dL   Protein, ur 30 (A) NEGATIVE mg/dL   Nitrite NEGATIVE NEGATIVE   Leukocytes, UA NEGATIVE NEGATIVE   RBC / HPF 0-5 0 - 5 RBC/hpf   WBC, UA 6-10 0 - 5 WBC/hpf   Bacteria, UA RARE (A) NONE SEEN   Squamous Epithelial / LPF 0-5 0 - 5   Mucus PRESENT        IMAGING No results found.  MAU Management/MDM: Ordered labs and reviewed results.  Headache cocktail with Benadryl 25 mg IV, Reglan 10 mg IV, and Decadron 10 mg IV, plus LR 1000 ml given with complete relief of h/a.  Rx for robinul given.  F/U with prenatal visits as scheduled. Pt discharged with strict return precautions.  ASSESSMENT  1. Nausea and vomiting during pregnancy prior to [redacted] weeks gestation   2. Migraine without aura and without status migrainosus, not intractable    PLAN Discharge home Allergies as of 05/27/2018   No Known Allergies     Medication List    STOP taking these medications   famotidine 20 MG tablet Commonly known as:  PEPCID     TAKE these medications   glycopyrrolate 2 MG tablet Commonly known as:  ROBINUL Take 1 tablet (2 mg total) by mouth 3 (three) times daily as needed.   metoCLOPramide 10 MG tablet Commonly known as:  REGLAN Take 1  tablet (10 mg total) by mouth every 6 (six) hours as needed for nausea.   ondansetron 8 MG disintegrating tablet Commonly known as:  ZOFRAN-ODT Take 1 tablet (8 mg total) by mouth every 8 (eight) hours as needed for nausea or vomiting.   pantoprazole 40 MG tablet Commonly known as:  PROTONIX Take 1 tablet (40 mg total) by mouth daily.         Sharen Counter Certified Nurse-Midwife 05/27/2018  1:26 PM

## 2018-05-27 NOTE — MAU Note (Signed)
Presents with c/o migraine and N&V.  Reports can't keep anything down.  Denies taking meds for H/A

## 2018-05-29 ENCOUNTER — Encounter: Payer: Self-pay | Admitting: Advanced Practice Midwife

## 2018-06-11 ENCOUNTER — Encounter: Payer: Self-pay | Admitting: Obstetrics and Gynecology

## 2018-06-11 ENCOUNTER — Ambulatory Visit (INDEPENDENT_AMBULATORY_CARE_PROVIDER_SITE_OTHER): Payer: Managed Care, Other (non HMO) | Admitting: Obstetrics and Gynecology

## 2018-06-11 ENCOUNTER — Other Ambulatory Visit (HOSPITAL_COMMUNITY)
Admission: RE | Admit: 2018-06-11 | Discharge: 2018-06-11 | Disposition: A | Discharge status: Home / Self Care | Payer: Managed Care, Other (non HMO) | Source: Ambulatory Visit | Attending: Obstetrics and Gynecology | Admitting: Obstetrics and Gynecology

## 2018-06-11 VITALS — BP 109/76 | HR 116 | Wt 134.6 lb

## 2018-06-11 DIAGNOSIS — N898 Other specified noninflammatory disorders of vagina: Secondary | ICD-10-CM

## 2018-06-11 DIAGNOSIS — R509 Fever, unspecified: Secondary | ICD-10-CM

## 2018-06-11 DIAGNOSIS — O09521 Supervision of elderly multigravida, first trimester: Secondary | ICD-10-CM

## 2018-06-11 DIAGNOSIS — Z98891 History of uterine scar from previous surgery: Secondary | ICD-10-CM

## 2018-06-11 DIAGNOSIS — O09522 Supervision of elderly multigravida, second trimester: Secondary | ICD-10-CM

## 2018-06-11 DIAGNOSIS — O0990 Supervision of high risk pregnancy, unspecified, unspecified trimester: Secondary | ICD-10-CM | POA: Diagnosis present

## 2018-06-11 DIAGNOSIS — Z3481 Encounter for supervision of other normal pregnancy, first trimester: Secondary | ICD-10-CM | POA: Diagnosis not present

## 2018-06-11 DIAGNOSIS — R7612 Nonspecific reaction to cell mediated immunity measurement of gamma interferon antigen response without active tuberculosis: Secondary | ICD-10-CM

## 2018-06-11 DIAGNOSIS — O0992 Supervision of high risk pregnancy, unspecified, second trimester: Secondary | ICD-10-CM

## 2018-06-11 DIAGNOSIS — O09529 Supervision of elderly multigravida, unspecified trimester: Secondary | ICD-10-CM | POA: Insufficient documentation

## 2018-06-11 HISTORY — DX: Supervision of high risk pregnancy, unspecified, unspecified trimester: O09.90

## 2018-06-11 MED ORDER — BUTALBITAL-APAP-CAFFEINE 50-325-40 MG PO CAPS: 1.0000 | ORAL_CAPSULE | Freq: Four times a day (QID) | ORAL | 0 refills | Status: DC | PRN

## 2018-06-11 NOTE — Progress Notes (Signed)
INITIAL PRENATAL VISIT NOTE  Subjective:  Nancy Miles is a 42 y.o. [id number] at 6656w5d by LMP being seen today for her initial prenatal visit. She and partner are happy with the pregnancy She has an obstetric history significant for 1CS for breech, then 2 x VBAC. She has a medical history significant for n/a.  Patient reports nausea and feeling feverish in hands and feet, vaginal discharge.  Contractions: Not present. Vag. Bleeding: None.   . Denies leaking of fluid.   Past Medical History:  Diagnosis Date  . Headache     Past Surgical History:  Procedure Laterality Date  . CESAREAN SECTION     transverse lie    OB History  Gravida Para Term Preterm AB Living  6 3 3   2 3   SAB TAB Ectopic Multiple Live Births  2       3    # Outcome Date GA Lbr Len/2nd Weight Sex Delivery Anes PTL Lv  6 Current           5 SAB 2015             Birth Comments: System Generated. Please review and update pregnancy details.  4 Term 07/24/06     VBAC   LIV  3 Term 01/31/01     VBAC   LIV  2 Term 11/09/98     CS-LTranv   LIV  1 SAB             Social History   Socioeconomic History  . Marital status: Married    Spouse name: Not on file  . Number of children: Not on file  . Years of education: Not on file  . Highest education level: Not on file  Occupational History  . Not on file  Social Needs  . Financial resource strain: Not on file  . Food insecurity:    Worry: Not on file    Inability: Not on file  . Transportation needs:    Medical: Not on file    Non-medical: Not on file  Tobacco Use  . Smoking status: Never Smoker  . Smokeless tobacco: Never Used  Substance and Sexual Activity  . Alcohol use: No  . Drug use: No  . Sexual activity: Yes  Lifestyle  . Physical activity:    Days per week: Not on file    Minutes per session: Not on file  . Stress: Not on file  Relationships  . Social connections:    Talks on phone: Not on file    Gets together: Not on file    Attends  religious service: Not on file    Active member of club or organization: Not on file    Attends meetings of clubs or organizations: Not on file    Relationship status: Not on file  Other Topics Concern  . Not on file  Social History Narrative  . Not on file    Family History  Problem Relation Age of Onset  . Healthy Mother   . Other Neg Hx      Current Outpatient Medications:  .  glycopyrrolate (ROBINUL) 2 MG tablet, Take 1 tablet (2 mg total) by mouth 3 (three) times daily as needed., Disp: 30 tablet, Rfl: 3 .  metoCLOPramide (REGLAN) 10 MG tablet, Take 1 tablet (10 mg total) by mouth every 6 (six) hours as needed for nausea., Disp: 90 tablet, Rfl: 2 .  ondansetron (ZOFRAN-ODT) 8 MG disintegrating tablet, Take 1 tablet (8 mg total)  by mouth every 8 (eight) hours as needed for nausea or vomiting., Disp: 30 tablet, Rfl: 2 .  pantoprazole (PROTONIX) 40 MG tablet, Take 1 tablet (40 mg total) by mouth daily., Disp: 30 tablet, Rfl: 2 .  Butalbital-APAP-Caffeine 50-325-40 MG capsule, Take 1-2 capsules by mouth every 6 (six) hours as needed for headache., Disp: 30 capsule, Rfl: 0  No Known Allergies  Review of Systems: Negative except for what is mentioned in HPI.  Objective:   Vitals:   06/11/18 0828  BP: 109/76  Pulse: (!) 116  Weight: 134 lb 9.6 oz (61.1 kg)    Fetal Status: Fetal Heart Rate (bpm): 160         Physical Exam: BP 109/76   Pulse (!) 116   Wt 134 lb 9.6 oz (61.1 kg)   LMP 03/07/2018   BMI 24.62 kg/m  CONSTITUTIONAL: Well-developed, well-nourished female in no acute distress.  NEUROLOGIC: Alert and oriented to person, place, and time. Normal reflexes, muscle tone coordination. No cranial nerve deficit noted. PSYCHIATRIC: Normal mood and affect. Normal behavior. Normal judgment and thought content. SKIN: Skin is warm and dry. No rash noted. Not diaphoretic. No erythema. No pallor. HENT:  Normocephalic, atraumatic, External right and left ear normal. Oropharynx  is clear and moist EYES: Conjunctivae and EOM are normal. Pupils are equal, round, and reactive to light. No scleral icterus.  NECK: Normal range of motion, supple, no masses CARDIOVASCULAR: Normal heart rate noted, regular rhythm RESPIRATORY: Effort and breath sounds normal, no problems with respiration noted BREASTS: deferred ABDOMEN: Soft, nontender, nondistended, gravid. Faint pfannenstiel incision visible GU: normal appearing external female genitalia with no labia minora noted, multiparous normal appearing cervix, scant white discharge in vagina, no lesions noted Bimanual: 13 weeks sized uterus, no adnexal tenderness or palpable lesions noted MUSCULOSKELETAL: Normal range of motion. EXT:  No edema and no tenderness. 2+ distal pulses.   Assessment and Plan:  Pregnancy: [id number] at 861w5d by LMP  1. Supervision of high risk pregnancy, antepartum - Genetic Screening - Hemoglobinopathy evaluation - Cystic Fibrosis Mutation 97 - Culture, OB Urine - Obstetric Panel, Including HIV - Cytology - PAP( [place])  2. Multigravida of advanced maternal age in first trimester Will need antenatal testing 3rd trimester - MM 3D SCREEN BREAST BILATERAL; Future  3. H/O: C-section Would like to TOLAC  4. Vaginal discharge - Cervicovaginal ancillary only( [place])  5. Fever, unspecified fever cause On hands and feet recently, reports tested for TB when emig[place] from [place] ago - QuantiFERON-TB Gold Plus   Preterm labor symptoms and general obstetric precautions including but not limited to vaginal bleeding, contractions, leaking of fluid and fetal movement were reviewed in detail with the patient.  Please refer to After Visit Summary for other counseling recommendations.   Return in about 4 weeks (around 07/09/2018) for OB visit (MD).  Conan BowensKelly M  06/11/2018 10:26 AM

## 2018-06-11 NOTE — Progress Notes (Signed)
Patient is in the office for initial ob visit with husband, no complaints today.

## 2018-06-11 NOTE — Patient Instructions (Signed)
You can buy Colace (docusate sodium) 100 mg by mouth once daily. If no improvement, take twice daily. This acts as a stool softener and should make it easier to have bowel movements. Please call the office with any questions.  

## 2018-06-13 LAB — CULTURE, OB URINE

## 2018-06-13 LAB — URINE CULTURE, OB REFLEX: Organism ID, Bacteria: NO GROWTH

## 2018-06-14 LAB — CERVICOVAGINAL ANCILLARY ONLY
Bacterial vaginitis: NEGATIVE
Candida vaginitis: NEGATIVE
Chlamydia: NEGATIVE
Neisseria Gonorrhea: NEGATIVE
Trichomonas: NEGATIVE

## 2018-06-14 LAB — CYTOLOGY - PAP
Chlamydia: NEGATIVE
DIAGNOSIS: NEGATIVE
HPV: NOT DETECTED
Neisseria Gonorrhea: NEGATIVE

## 2018-06-17 LAB — OBSTETRIC PANEL, INCLUDING HIV
ANTIBODY SCREEN: NEGATIVE
Basophils Absolute: 0 10*3/uL (ref 0.0–0.2)
Basos: 0 %
EOS (ABSOLUTE): 0.1 10*3/uL (ref 0.0–0.4)
Eos: 1 %
HIV Screen 4th Generation wRfx: NONREACTIVE
Hematocrit: 33.1 % — ABNORMAL LOW (ref 34.0–46.6)
Hemoglobin: 11 g/dL — ABNORMAL LOW (ref 11.1–15.9)
Hepatitis B Surface Ag: NEGATIVE
Immature Grans (Abs): 0 10*3/uL (ref 0.0–0.1)
Immature Granulocytes: 0 %
Lymphocytes Absolute: 1.4 10*3/uL (ref 0.7–3.1)
Lymphs: 23 %
MCH: 27.6 pg (ref 26.6–33.0)
MCHC: 33.2 g/dL (ref 31.5–35.7)
MCV: 83 fL (ref 79–97)
Monocytes Absolute: 0.6 10*3/uL (ref 0.1–0.9)
Monocytes: 10 %
Neutrophils Absolute: 4.1 10*3/uL (ref 1.4–7.0)
Neutrophils: 66 %
Platelets: 476 10*3/uL — ABNORMAL HIGH (ref 150–450)
RBC: 3.99 x10E6/uL (ref 3.77–5.28)
RDW: 14.9 % (ref 12.3–15.4)
RPR Ser Ql: NONREACTIVE
Rh Factor: POSITIVE
Rubella Antibodies, IGG: 3.16 index (ref >0.99)
WBC: 6.2 10*3/uL (ref 3.4–10.8)

## 2018-06-17 LAB — HEMOGLOBINOPATHY EVALUATION
HGB C: 0 %
HGB S: 0 %
HGB VARIANT: 0 %
Hemoglobin A2 Quantitation: 2.2 % (ref 1.8–3.2)
Hemoglobin F Quantitation: 0 % (ref 0.0–2.0)
Hgb A: 97.8 % (ref 96.4–98.8)

## 2018-06-17 LAB — CYSTIC FIBROSIS MUTATION 97: GENE DIS ANAL CARRIER INTERP BLD/T-IMP: NOT DETECTED

## 2018-06-19 LAB — QUANTIFERON-TB GOLD PLUS
QuantiFERON Mitogen Value: >10 IU/mL
QuantiFERON Nil Value: 0.18 IU/mL
QuantiFERON TB1 Ag Value: >10 IU/mL
QuantiFERON TB2 Ag Value: >10 IU/mL
QuantiFERON-TB Gold Plus: POSITIVE — AB

## 2018-06-20 NOTE — Addendum Note (Signed)
Addended by: Leroy LibmanAVIS, Mayra Jolliffe on: 06/20/2018 08:14 PM   Modules accepted: Orders

## 2018-06-21 ENCOUNTER — Telehealth: Payer: Self-pay

## 2018-06-21 DIAGNOSIS — A159 Respiratory tuberculosis unspecified: Secondary | ICD-10-CM

## 2018-06-21 DIAGNOSIS — O0990 Supervision of high risk pregnancy, unspecified, unspecified trimester: Secondary | ICD-10-CM

## 2018-06-21 NOTE — Telephone Encounter (Signed)
Attempted to contact w/ interpreter, about tb results, chest x-ray and referral, no answer, left vm.

## 2018-06-23 NOTE — L&D Delivery Note (Signed)
Delivery Note Nancy Miles is a 43 y.o. [id number] at [redacted]w[redacted]d admitted for SOL.  Labor course: Pt pushed on and off x1 hour ROM: 5h 54m with clear fluid  At 2150 a viable boy was delivered via spontaneous vaginal delivery (Presentation: vertex;LOA). Nuchal x1 and baby somersaulted to reduce. Infant placed directly on mom's abdomen for bonding/skin-to-skin. Delayed cord clamping x 45min, then cord clamped x 2, and cut by father of the baby. APGAR: 8,9 ; weight: pending at time of note. 40 units of pitocin diluted in 1000cc LR was infused rapidly IV per protocol. The placenta separated spontaneously and delivered via CCT and maternal pushing effort.  It was inspected and appears to be intact with a 3 VC.  Placenta/Cord with the following complications: none. Cord pH: n/a  Intrapartum complications:  None Anesthesia:  none Episiotomy: none Lacerations:  none Suture Repair: n/a Est. Blood Loss (mL): 78 Sponge and instrument count were correct x2.  Mom to postpartum.  Baby to Couplet care / Skin to Skin. Placenta to L&D. Plans to breast and bottle feed Contraception: none Circ: no  Renee Harder, SNM 12/05/2018 10:00 PM

## 2018-06-24 ENCOUNTER — Telehealth: Payer: Self-pay

## 2018-06-24 ENCOUNTER — Encounter: Payer: Self-pay | Admitting: Obstetrics and Gynecology

## 2018-06-24 DIAGNOSIS — R7612 Nonspecific reaction to cell mediated immunity measurement of gamma interferon antigen response without active tuberculosis: Secondary | ICD-10-CM | POA: Insufficient documentation

## 2018-06-24 NOTE — Telephone Encounter (Signed)
Pt contacted through interpreter line to discuss lab results. Pt verbalized understanding

## 2018-06-28 ENCOUNTER — Inpatient Hospital Stay (HOSPITAL_COMMUNITY)
Admission: AD | Admit: 2018-06-28 | Discharge: 2018-06-28 | Disposition: A | Discharge status: Home / Self Care | Payer: Managed Care, Other (non HMO) | Attending: Family Medicine | Admitting: Family Medicine

## 2018-06-28 ENCOUNTER — Ambulatory Visit (HOSPITAL_COMMUNITY)
Admission: RE | Admit: 2018-06-28 | Discharge: 2018-06-28 | Disposition: A | Discharge status: Home / Self Care | Payer: Managed Care, Other (non HMO) | Source: Ambulatory Visit | Attending: Obstetrics and Gynecology | Admitting: Obstetrics and Gynecology

## 2018-06-28 DIAGNOSIS — R7612 Nonspecific reaction to cell mediated immunity measurement of gamma interferon antigen response without active tuberculosis: Secondary | ICD-10-CM | POA: Diagnosis present

## 2018-07-09 ENCOUNTER — Ambulatory Visit (INDEPENDENT_AMBULATORY_CARE_PROVIDER_SITE_OTHER): Payer: Managed Care, Other (non HMO) | Admitting: Obstetrics & Gynecology

## 2018-07-09 ENCOUNTER — Encounter: Payer: Self-pay | Admitting: Obstetrics & Gynecology

## 2018-07-09 VITALS — BP 97/64 | HR 89 | Wt 136.0 lb

## 2018-07-09 DIAGNOSIS — O09522 Supervision of elderly multigravida, second trimester: Secondary | ICD-10-CM

## 2018-07-09 DIAGNOSIS — O0992 Supervision of high risk pregnancy, unspecified, second trimester: Secondary | ICD-10-CM

## 2018-07-09 DIAGNOSIS — R7612 Nonspecific reaction to cell mediated immunity measurement of gamma interferon antigen response without active tuberculosis: Secondary | ICD-10-CM

## 2018-07-09 DIAGNOSIS — O0990 Supervision of high risk pregnancy, unspecified, unspecified trimester: Secondary | ICD-10-CM

## 2018-07-09 DIAGNOSIS — Z3A17 17 weeks gestation of pregnancy: Secondary | ICD-10-CM

## 2018-07-09 NOTE — Progress Notes (Signed)
   PRENATAL VISIT NOTE  Subjective:  Nancy Miles is a 43 y.o. [id number] at [redacted]w[redacted]d being seen today for ongoing prenatal care.  She is currently monitored for the following issues for this high-risk pregnancy and has SAB (spontaneous abortion); Supervision of high risk pregnancy, antepartum; AMA (advanced maternal age) multigravida 35+; H/O: C-section; and Positive QuantiFERON-TB Gold test on their problem list.  Patient reports no complaints.  Contractions: Not present. Vag. Bleeding: None.   . Denies leaking of fluid.   The following portions of the patient's history were reviewed and updated as appropriate: allergies, current medications, past family history, past medical history, past social history, past surgical history and problem list. Problem list updated.  Objective:   Vitals:   07/09/18 1006  BP: 97/64  Pulse: 89  Weight: 136 lb (61.7 kg)    Fetal Status: Fetal Heart Rate (bpm): 155         General:  Alert, oriented and cooperative. Patient is in no acute distress.  Skin: Skin is warm and dry. No rash noted.   Cardiovascular: Normal heart rate noted  Respiratory: Normal respiratory effort, no problems with respiration noted  Abdomen: Soft, gravid, appropriate for gestational age.  Pain/Pressure: Absent     Pelvic: Cervical exam deferred        Extremities: Normal range of motion.     Mental Status: Normal mood and affect. Normal behavior. Normal judgment and thought content.   Assessment and Plan:  Pregnancy: [id number] at [redacted]w[redacted]d  1. Supervision of high risk pregnancy, antepartum   2. Multigravida of advanced maternal age in second trimester   3. Positive QuantiFERON-TB Gold test -negative CXR - has appt with ID  Preterm labor symptoms and general obstetric precautions including but not limited to vaginal bleeding, contractions, leaking of fluid and fetal movement were reviewed in detail with the patient. Please refer to After Visit Summary for other counseling  recommendations.  No follow-ups on file.  Future Appointments  Date Time Provider Department Center  07/14/2018  9:00 AM Daiva Eves, Lisette Grinder, MD [facility]  07/19/2018  9:30 AM [facility] [place] 1 [facility]    Allie Bossier, MD

## 2018-07-12 ENCOUNTER — Encounter (HOSPITAL_COMMUNITY): Payer: Self-pay

## 2018-07-14 ENCOUNTER — Encounter: Payer: Self-pay | Admitting: Infectious Disease

## 2018-07-14 ENCOUNTER — Ambulatory Visit (INDEPENDENT_AMBULATORY_CARE_PROVIDER_SITE_OTHER): Payer: Managed Care, Other (non HMO) | Admitting: Infectious Disease

## 2018-07-14 VITALS — BP 105/68 | HR 99 | Temp 99.0°F | Wt 137.0 lb

## 2018-07-14 DIAGNOSIS — Z23 Encounter for immunization: Secondary | ICD-10-CM

## 2018-07-14 DIAGNOSIS — Z227 Latent tuberculosis: Secondary | ICD-10-CM

## 2018-07-14 DIAGNOSIS — R7612 Nonspecific reaction to cell mediated immunity measurement of gamma interferon antigen response without active tuberculosis: Secondary | ICD-10-CM

## 2018-07-14 DIAGNOSIS — O0990 Supervision of high risk pregnancy, unspecified, unspecified trimester: Secondary | ICD-10-CM

## 2018-07-14 HISTORY — DX: Latent tuberculosis: Z22.7

## 2018-07-14 NOTE — Progress Notes (Signed)
Reason for infectious he is consult: Positive QuantiFERON gold test in pregnant lady  Requesting physician: Leroy LibmanKelly Davis, MD    Subjective:    Patient ID: Nancy Miles, female    DOB: 10/10/1975, 43 y.o.   MRN: [id number]  HPI  Nancy Miles is a 43 year old lady who originally is from [place] who is currently in through the 18th week of her pregnancy.  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  She also had a chest x-ray which negative.  In talking to her today I asked her about her history of positive test for TB and she told me that she tested positive for tuberculosis when [place] first immig[place] to [place].  [facility] with 4 months of rifampin.  There will be no need in future to repeat QuantiFERON gold test as these should be positive for the rest of her life if her immune system remains healthy.  I reviewed other labs and screening tests and she has been appropriately tested for hepatitis B and HIV during this pregnancy and found to be negative for both.  She did not receive her influenza shot, she was reasoning that she had not gotten the flu shot for several years and not succumb to the flu.  I informed her that being pregnant was a very high risk factor for severe infection with influenza.  She agreed to have a flu shot this year.  I also encouraged her to get it every year going forward.    Past Medical History:  Diagnosis Date  . Headache   . Supervision of high risk pregnancy, antepartum 06/11/2018   .Marland Kitchen. Nursing Staff Provider Office Location [facility] Dating  LMP Language   English & Trigrinya Anatomy US   Flu Vaccine   Genetic Screen  NIPS:  Low risk   TDaP vaccine    Hgb A1C or  GTT Early  Third trimester  Rhogam     LAB RESULTS  Feeding Plan Breast/Bottle Blood Type O/Positive/-- (12/20 0959)  Contraception Undecided Antibody Negative (12/20 0959)  Circumcision  Rubella 3.16 (12/20 0959) Pe  . 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 CESAREAN SECTION     transverse lie    Family History  Problem Relation Age of Onset  . Healthy Mother   . Other Neg Hx       Social History   Socioeconomic History  . Marital status: Married    Spouse name: Not on file  . Number of children: Not on file  . Years of education: Not on file  . Highest education level: Not on file  Occupational History  . Not on file  Social Needs  . Financial resource strain: Not on file  . Food insecurity:    Worry: Not on file    Inability: Not on file  . Transportation needs:    Medical: Not on file    Non-medical: Not on file  Tobacco Use  . Smoking status: Never Smoker  . Smokeless tobacco: Never Used  Substance and Sexual Activity  . Alcohol use: No  . Drug use: No  . Sexual activity: Yes  Lifestyle  . Physical activity:    Days per week: Not on file    Minutes per session: Not on file  . Stress: Not on file  Relationships  . Social connections:    Talks on phone: Not  on file    Gets together: Not on file    Attends religious service: Not on file    Active member of club or organization: Not on file    Attends meetings of clubs or organizations: Not on file    Relationship status: Not on file  Other Topics Concern  . Not on file  Social History Narrative  . Not on file    No Known Allergies   Current Outpatient Medications:  .  Butalbital-APAP-Caffeine 50-325-40 MG capsule, Take 1-2 capsules by mouth every 6 (six) hours as needed for headache., Disp: 30 capsule, Rfl: 0 .  ondansetron (ZOFRAN-ODT) 8 MG disintegrating tablet, Take 1 tablet (8 mg total) by mouth every 8 (eight) hours as needed for nausea or vomiting., Disp: 30 tablet, Rfl: 2 .  glycopyrrolate (ROBINUL) 2 MG tablet, Take 1 tablet (2 mg total) by mouth 3 (three) times daily as needed. (Patient not taking: Reported on  07/14/2018), Disp: 30 tablet, Rfl: 3 .  metoCLOPramide (REGLAN) 10 MG tablet, Take 1 tablet (10 mg total) by mouth every 6 (six) hours as needed for nausea. (Patient not taking: Reported on 07/14/2018), Disp: 90 tablet, Rfl: 2 .  pantoprazole (PROTONIX) 40 MG tablet, Take 1 tablet (40 mg total) by mouth daily. (Patient not taking: Reported on 07/14/2018), Disp: 30 tablet, Rfl: 2   Review of Systems  Constitutional: Negative for chills and fever.  HENT: Negative for congestion and sore throat.   Eyes: Negative for photophobia.  Respiratory: Negative for cough, shortness of breath and wheezing.   Cardiovascular: Negative for chest pain, palpitations and leg swelling.  Gastrointestinal: Negative for abdominal pain, blood in stool, constipation, diarrhea, nausea and vomiting.  Genitourinary: Negative for dysuria, flank pain and hematuria.  Musculoskeletal: Negative for back pain and myalgias.  Skin: Negative for rash.  Neurological: Negative for dizziness, weakness and headaches.  Hematological: Does not bruise/bleed easily.  Psychiatric/Behavioral: Negative for suicidal ideas.       Objective:   Physical Exam Constitutional:      General: She is not in acute distress.    Appearance: Normal appearance. She is well-developed. She is not ill-appearing or diaphoretic.  HENT:     Head: Normocephalic and atraumatic.     Right Ear: Hearing and external ear normal.     Left Ear: Hearing and external ear normal.     Nose: Nose normal. No nasal deformity or rhinorrhea.  Eyes:     General: No scleral icterus.    Conjunctiva/sclera: Conjunctivae normal.     Right eye: Right conjunctiva is not injected.     Left eye: Left conjunctiva is not injected.     Pupils: Pupils are equal, round, and reactive to light.  Neck:     Musculoskeletal: Normal range of motion and neck supple.     Vascular: No JVD.  Cardiovascular:     Rate and Rhythm: Normal rate and regular rhythm.     Heart sounds: Normal  heart sounds, S1 normal and S2 normal. No murmur. No friction rub. No gallop.   Pulmonary:     Effort: Pulmonary effort is normal. No respiratory distress.     Breath sounds: Normal breath sounds. No stridor. No wheezing, rhonchi or rales.  Abdominal:     General: There is no distension.     Palpations: Abdomen is soft.  Musculoskeletal: Normal range of motion.     Right shoulder: Normal.     Left shoulder: Normal.     Right hip:  Normal.     Left hip: Normal.     Right knee: Normal.     Left knee: Normal.  Lymphadenopathy:     Head:     Right side of head: No submandibular, preauricular or posterior auricular adenopathy.     Left side of head: No submandibular, preauricular or posterior auricular adenopathy.     Cervical: No cervical adenopathy.     Right cervical: No superficial or deep cervical adenopathy.    Left cervical: No superficial or deep cervical adenopathy.  Skin:    General: Skin is warm and dry.     Coloration: Skin is not pale.     Findings: No abrasion, bruising, ecchymosis, erythema, lesion or rash.     Nails: There is no clubbing.   Neurological:     Mental Status: She is alert and oriented to person, place, and time.     Sensory: No sensory deficit.     Coordination: Coordination normal.     Gait: Gait normal.  Psychiatric:        Attention and Perception: She is attentive.        Mood and Affect: Mood normal.        Speech: Speech normal.        Behavior: Behavior normal. Behavior is cooperative.        Thought Content: Thought content normal.        Judgment: Judgment normal.           Assessment & Plan:   Latent TB: He has been treated with 4 months of rifampin.  We called the health department and received the transcripts confirming her history of treatment.  High risk pregnancy: She did get the flu shot the day went and I am happy about.  She can come back and see [place] if she needs to.

## 2018-07-19 ENCOUNTER — Ambulatory Visit (HOSPITAL_COMMUNITY)
Admission: RE | Admit: 2018-07-19 | Discharge: 2018-07-19 | Disposition: A | Discharge status: Home / Self Care | Payer: Managed Care, Other (non HMO) | Source: Ambulatory Visit | Attending: Obstetrics and Gynecology | Admitting: Obstetrics and Gynecology

## 2018-07-19 ENCOUNTER — Encounter (HOSPITAL_COMMUNITY): Payer: Self-pay

## 2018-07-19 ENCOUNTER — Other Ambulatory Visit (HOSPITAL_COMMUNITY): Payer: Self-pay | Admitting: *Deleted

## 2018-07-19 ENCOUNTER — Other Ambulatory Visit: Payer: Self-pay | Admitting: Obstetrics and Gynecology

## 2018-07-19 DIAGNOSIS — O09529 Supervision of elderly multigravida, unspecified trimester: Secondary | ICD-10-CM

## 2018-07-19 DIAGNOSIS — O09522 Supervision of elderly multigravida, second trimester: Secondary | ICD-10-CM

## 2018-07-19 DIAGNOSIS — Z363 Encounter for antenatal screening for malformations: Secondary | ICD-10-CM

## 2018-07-19 DIAGNOSIS — Z3A19 19 weeks gestation of pregnancy: Secondary | ICD-10-CM | POA: Diagnosis not present

## 2018-07-19 DIAGNOSIS — O2692 Pregnancy related conditions, unspecified, second trimester: Secondary | ICD-10-CM

## 2018-07-19 DIAGNOSIS — O0990 Supervision of high risk pregnancy, unspecified, unspecified trimester: Secondary | ICD-10-CM

## 2018-07-19 DIAGNOSIS — Z362 Encounter for other antenatal screening follow-up: Secondary | ICD-10-CM

## 2018-08-06 ENCOUNTER — Ambulatory Visit (INDEPENDENT_AMBULATORY_CARE_PROVIDER_SITE_OTHER): Payer: Managed Care, Other (non HMO) | Admitting: Obstetrics and Gynecology

## 2018-08-06 ENCOUNTER — Other Ambulatory Visit: Payer: Self-pay

## 2018-08-06 ENCOUNTER — Encounter: Payer: Self-pay | Admitting: Obstetrics and Gynecology

## 2018-08-06 VITALS — BP 99/65 | HR 102 | Wt 141.0 lb

## 2018-08-06 DIAGNOSIS — O09522 Supervision of elderly multigravida, second trimester: Secondary | ICD-10-CM

## 2018-08-06 DIAGNOSIS — O0992 Supervision of high risk pregnancy, unspecified, second trimester: Secondary | ICD-10-CM

## 2018-08-06 DIAGNOSIS — O0990 Supervision of high risk pregnancy, unspecified, unspecified trimester: Secondary | ICD-10-CM

## 2018-08-06 DIAGNOSIS — Z3A21 21 weeks gestation of pregnancy: Secondary | ICD-10-CM

## 2018-08-06 DIAGNOSIS — Z98891 History of uterine scar from previous surgery: Secondary | ICD-10-CM

## 2018-08-06 MED ORDER — FAMOTIDINE 20 MG PO TABS: 20.0000 mg | ORAL_TABLET | Freq: Two times a day (BID) | ORAL | 3 refills | Status: DC

## 2018-08-06 MED ORDER — PRENATE MINI 18-0.6-0.4-350 MG PO CAPS: 1.0000 | ORAL_CAPSULE | Freq: Every day | ORAL | 12 refills | Status: DC

## 2018-08-06 NOTE — Progress Notes (Signed)
ROB.  C/o heart palpitating whenever she eats breakfast. Patient said that she got her FLU shot the [facility] 07/14/2018

## 2018-08-06 NOTE — Progress Notes (Signed)
   PRENATAL VISIT NOTE  Subjective:  Nancy Miles is a 43 y.o. [id number] at [redacted]w[redacted]d being seen today for ongoing prenatal care.  She is currently monitored for the following issues for this high-risk pregnancy and has Supervision of high risk pregnancy, antepartum; AMA (advanced maternal age) multigravida 35+; H/O: C-section; Positive QuantiFERON-TB Gold test; and TB lung, latent on their problem list.  Patient reports heartburn and palpitation while eating breaskfast.  Contractions: Not present. Vag. Bleeding: None.  Movement: Present. Denies leaking of fluid.   The following portions of the patient's history were reviewed and updated as appropriate: allergies, current medications, past family history, past medical history, past social history, past surgical history and problem list. Problem list updated.  Objective:   Vitals:   08/06/18 0939  BP: 99/65  Pulse: (!) 102  Weight: 141 lb (64 kg)    Fetal Status: Fetal Heart Rate (bpm): 154 Fundal Height: 21 cm Movement: Present     General:  Alert, oriented and cooperative. Patient is in no acute distress.  Skin: Skin is warm and dry. No rash noted.   Cardiovascular: Normal heart rate noted  Respiratory: Normal respiratory effort, no problems with respiration noted  Abdomen: Soft, gravid, appropriate for gestational age.  Pain/Pressure: Absent     Pelvic: Cervical exam deferred        Extremities: Normal range of motion.  Edema: None  Mental Status: Normal mood and affect. Normal behavior. Normal judgment and thought content.   Assessment and Plan:  Pregnancy: [id number] at [redacted]w[redacted]d  1. Supervision of high risk pregnancy, antepartum Patient is doing well  Advised to ensure that she is well hydrated and eat sooner than 10 am Rx Pepcid provided Follow up anatomy ultrasound scheduled - AFP, Serum, Open Spina Bifida  2. Multigravida of advanced maternal age in second trimester AFP today  3. H/O: C-section Will discuss TOLAC at later  visit  Preterm labor symptoms and general obstetric precautions including but not limited to vaginal bleeding, contractions, leaking of fluid and fetal movement were reviewed in detail with the patient. Please refer to After Visit Summary for other counseling recommendations.  Return in about 4 weeks (around 09/03/2018) for ROB.  Future Appointments  Date Time Provider Department Center  08/19/2018 10:00 AM [facility] [place] 3 [facility]    Catalina Antigua, MD

## 2018-08-10 LAB — AFP, SERUM, OPEN SPINA BIFIDA
AFP MoM: 1.75
AFP Value: 138.2 ng/mL
Gest. Age on Collection Date: 21.5 weeks
Maternal Age At EDD: 43.8 yr
OSBR Risk 1 IN: 2883
Test Results:: NEGATIVE
Weight: 141 [lb_av]

## 2018-08-19 ENCOUNTER — Ambulatory Visit (HOSPITAL_COMMUNITY)
Admission: RE | Admit: 2018-08-19 | Discharge: 2018-08-19 | Disposition: A | Discharge status: Home / Self Care | Payer: Managed Care, Other (non HMO) | Source: Ambulatory Visit | Attending: Obstetrics and Gynecology | Admitting: Obstetrics and Gynecology

## 2018-08-19 ENCOUNTER — Ambulatory Visit (HOSPITAL_BASED_OUTPATIENT_CLINIC_OR_DEPARTMENT_OTHER): Payer: Managed Care, Other (non HMO) | Admitting: *Deleted

## 2018-08-19 ENCOUNTER — Encounter (HOSPITAL_COMMUNITY): Payer: Self-pay

## 2018-08-19 DIAGNOSIS — Z3A23 23 weeks gestation of pregnancy: Secondary | ICD-10-CM

## 2018-08-19 DIAGNOSIS — Z98891 History of uterine scar from previous surgery: Secondary | ICD-10-CM | POA: Diagnosis not present

## 2018-08-19 DIAGNOSIS — O2692 Pregnancy related conditions, unspecified, second trimester: Secondary | ICD-10-CM | POA: Diagnosis not present

## 2018-08-19 DIAGNOSIS — Z362 Encounter for other antenatal screening follow-up: Secondary | ICD-10-CM | POA: Insufficient documentation

## 2018-08-19 DIAGNOSIS — O09529 Supervision of elderly multigravida, unspecified trimester: Secondary | ICD-10-CM | POA: Insufficient documentation

## 2018-08-19 DIAGNOSIS — R7612 Nonspecific reaction to cell mediated immunity measurement of gamma interferon antigen response without active tuberculosis: Secondary | ICD-10-CM

## 2018-08-19 DIAGNOSIS — O09522 Supervision of elderly multigravida, second trimester: Secondary | ICD-10-CM | POA: Insufficient documentation

## 2018-08-20 ENCOUNTER — Other Ambulatory Visit (HOSPITAL_COMMUNITY): Payer: Self-pay | Admitting: *Deleted

## 2018-08-20 DIAGNOSIS — O409XX0 Polyhydramnios, unspecified trimester, not applicable or unspecified: Secondary | ICD-10-CM

## 2018-08-20 NOTE — Progress Notes (Unsigned)
.  mfm

## 2018-09-02 ENCOUNTER — Encounter: Payer: Self-pay | Admitting: Obstetrics and Gynecology

## 2018-09-02 ENCOUNTER — Other Ambulatory Visit: Payer: Self-pay

## 2018-09-02 ENCOUNTER — Ambulatory Visit (INDEPENDENT_AMBULATORY_CARE_PROVIDER_SITE_OTHER): Payer: Managed Care, Other (non HMO) | Admitting: Obstetrics and Gynecology

## 2018-09-02 VITALS — BP 102/69 | HR 98 | Wt 144.0 lb

## 2018-09-02 DIAGNOSIS — O26892 Other specified pregnancy related conditions, second trimester: Secondary | ICD-10-CM

## 2018-09-02 DIAGNOSIS — O401XX0 Polyhydramnios, first trimester, not applicable or unspecified: Secondary | ICD-10-CM

## 2018-09-02 DIAGNOSIS — Z3A25 25 weeks gestation of pregnancy: Secondary | ICD-10-CM

## 2018-09-02 DIAGNOSIS — O09522 Supervision of elderly multigravida, second trimester: Secondary | ICD-10-CM

## 2018-09-02 DIAGNOSIS — M25532 Pain in left wrist: Secondary | ICD-10-CM

## 2018-09-02 DIAGNOSIS — O0990 Supervision of high risk pregnancy, unspecified, unspecified trimester: Secondary | ICD-10-CM

## 2018-09-02 DIAGNOSIS — M25531 Pain in right wrist: Secondary | ICD-10-CM

## 2018-09-02 DIAGNOSIS — Z227 Latent tuberculosis: Secondary | ICD-10-CM

## 2018-09-02 DIAGNOSIS — Z98891 History of uterine scar from previous surgery: Secondary | ICD-10-CM

## 2018-09-02 DIAGNOSIS — O409XX0 Polyhydramnios, unspecified trimester, not applicable or unspecified: Secondary | ICD-10-CM | POA: Insufficient documentation

## 2018-09-02 NOTE — Progress Notes (Signed)
   PRENATAL VISIT NOTE  Subjective:  Nancy Miles is a 43 y.o. [id number] at [redacted]w[redacted]d being seen today for ongoing prenatal care.  She is currently monitored for the following issues for this high-risk pregnancy and has Supervision of high risk pregnancy, antepartum; AMA (advanced maternal age) multigravida 35+; H/O: C-section; Positive QuantiFERON-TB Gold test; TB lung, latent; and Polyhydramnios on their problem list.  Patient reports numbness and pain in hands., right more than left. Also with some swelling of feet.   Contractions: Not present.  .  Movement: Present. Denies leaking of fluid.   The following portions of the patient's history were reviewed and updated as appropriate: allergies, current medications, past family history, past medical history, past social history, past surgical history and problem list.   Objective:   Vitals:   09/02/18 1035  BP: 102/69  Pulse: 98  Weight: 144 lb (65.3 kg)    Fetal Status: Fetal Heart Rate (bpm): 160 Fundal Height: 26 cm Movement: Present     General:  Alert, oriented and cooperative. Patient is in no acute distress.  Skin: Skin is warm and dry. No rash noted.   Cardiovascular: Normal heart rate noted  Respiratory: Normal respiratory effort, no problems with respiration noted  Abdomen: Soft, gravid, appropriate for gestational age.  Pain/Pressure: Absent     Pelvic: Cervical exam deferred        Extremities: Normal range of motion.     Mental Status: Normal mood and affect. Normal behavior. Normal judgment and thought content.   Assessment and Plan:  Pregnancy: [id number] at [redacted]w[redacted]d 1. Supervision of high risk pregnancy, antepartum  2. TB lung, latent Per ID, no further treatment necessary  3. Multigravida of advanced maternal age in second trimester Will need testing starting 36 weeks  4. H/O: C-section  5. Pain in both wrists Encouraged use of ice packs and wrist brace As she is having pain up to shoulder, will refer to PT  6.  Polyhydramnios in first trimester, single or unspecified fetus Has repeat US 3/26 To get 2 hr GTT next visit   Preterm labor symptoms and general obstetric precautions including but not limited to vaginal bleeding, contractions, leaking of fluid and fetal movement were reviewed in detail with the patient. Please refer to After Visit Summary for other counseling recommendations.   Return in about 2 weeks (around 09/16/2018) for 3rd trim labs, 2 hr GTT, OB visit (MD).  Future Appointments  Date Time Provider Department Center  09/16/2018 10:35 AM [facility] NURSE [facility]  09/16/2018 10:45 AM [facility] [place] 2 [facility]    Conan Bowens, MD

## 2018-09-16 ENCOUNTER — Ambulatory Visit (HOSPITAL_COMMUNITY)
Admission: RE | Admit: 2018-09-16 | Payer: Managed Care, Other (non HMO) | Source: Ambulatory Visit | Attending: Obstetrics and Gynecology | Admitting: Obstetrics and Gynecology

## 2018-09-16 ENCOUNTER — Ambulatory Visit (HOSPITAL_COMMUNITY): Payer: Managed Care, Other (non HMO)

## 2018-09-17 ENCOUNTER — Other Ambulatory Visit: Payer: Managed Care, Other (non HMO)

## 2018-09-17 ENCOUNTER — Encounter: Payer: Self-pay | Admitting: Obstetrics

## 2018-09-17 ENCOUNTER — Ambulatory Visit (INDEPENDENT_AMBULATORY_CARE_PROVIDER_SITE_OTHER): Payer: Managed Care, Other (non HMO) | Admitting: Obstetrics and Gynecology

## 2018-09-17 ENCOUNTER — Encounter: Payer: Self-pay | Admitting: Obstetrics and Gynecology

## 2018-09-17 ENCOUNTER — Other Ambulatory Visit: Payer: Self-pay

## 2018-09-17 VITALS — BP 118/78 | HR 109 | Wt 142.9 lb

## 2018-09-17 DIAGNOSIS — O0990 Supervision of high risk pregnancy, unspecified, unspecified trimester: Secondary | ICD-10-CM

## 2018-09-17 DIAGNOSIS — O403XX0 Polyhydramnios, third trimester, not applicable or unspecified: Secondary | ICD-10-CM

## 2018-09-17 DIAGNOSIS — Z98891 History of uterine scar from previous surgery: Secondary | ICD-10-CM

## 2018-09-17 DIAGNOSIS — O09522 Supervision of elderly multigravida, second trimester: Secondary | ICD-10-CM

## 2018-09-17 DIAGNOSIS — M25531 Pain in right wrist: Secondary | ICD-10-CM

## 2018-09-17 DIAGNOSIS — Z23 Encounter for immunization: Secondary | ICD-10-CM | POA: Diagnosis not present

## 2018-09-17 DIAGNOSIS — Z3A27 27 weeks gestation of pregnancy: Secondary | ICD-10-CM

## 2018-09-17 DIAGNOSIS — O09523 Supervision of elderly multigravida, third trimester: Secondary | ICD-10-CM

## 2018-09-17 DIAGNOSIS — O26892 Other specified pregnancy related conditions, second trimester: Secondary | ICD-10-CM

## 2018-09-17 MED ORDER — VITAFOL GUMMIES 3.33-0.333-34.8 MG PO CHEW: 1.0000 | CHEWABLE_TABLET | Freq: Every day | ORAL | 5 refills | Status: DC

## 2018-09-17 NOTE — Progress Notes (Signed)
Pt is here for ROB/2hr GTT. [redacted]w[redacted]d.

## 2018-09-17 NOTE — Progress Notes (Signed)
   PRENATAL VISIT NOTE  Subjective:  Nancy Miles is a 43 y.o. [id number] at [redacted]w[redacted]d being seen today for ongoing prenatal care.  She is currently monitored for the following issues for this high-risk pregnancy and has Supervision of high risk pregnancy, antepartum; AMA (advanced maternal age) multigravida 35+; H/O: C-section; Positive QuantiFERON-TB Gold test; TB lung, latent; and Polyhydramnios on their problem list.  Patient reports no complaints.  Contractions: Not present. Vag. Bleeding: None.  Movement: Present. Denies leaking of fluid.   The following portions of the patient's history were reviewed and updated as appropriate: allergies, current medications, past family history, past medical history, past social history, past surgical history and problem list.   Objective:   Vitals:   09/17/18 0922  BP: 118/78  Pulse: (!) 109  Weight: 142 lb 14.4 oz (64.8 kg)   Fetal Status: Fetal Heart Rate (bpm): 160   Movement: Present     General:  Alert, oriented and cooperative. Patient is in no acute distress.  Skin: Skin is warm and dry. No rash noted.   Cardiovascular: Normal heart rate noted  Respiratory: Normal respiratory effort, no problems with respiration noted  Abdomen: Soft, gravid, appropriate for gestational age.  Pain/Pressure: Absent     Pelvic: Cervical exam deferred        Extremities: Normal range of motion.  Edema: None  Mental Status: Normal mood and affect. Normal behavior. Normal judgment and thought content.   Assessment and Plan:  Pregnancy: [id number] at [redacted]w[redacted]d  1. Supervision of high risk pregnancy, antepartum - Glucose Tolerance, 2 Hours w/1 Hour - CBC - RPR - HIV Antibody (routine testing w rflx) - Tdap vaccine greater than or equal to 7yo IM  2. H/O: C-section Reviewed risks/benefits of TOLAC versus RCS in detail. Patient counseled regarding potential vaginal delivery, chance of success, future implications, possible uterine rupture and need for urgent/emergent  repeat cesarean. Counseled regarding potential need for repeat c-section for reasons unrelated to first c-section. Counseled regarding scheduled repeat cesarean including risks of bleeding, infection, damage to surrounding tissue, abnormal placentation, implications for future pregnancies. All questions answered.  Patient desires TOLAC, consent signed 09/17/2018. Consent done using [employer] trader.   3. Polyhydramnios in third trimester complication, single or unspecified fetus Has f/u scheduled for 10/05/18  4. Multigravida of advanced maternal age in third trimester Needs testing starting 36 weeks  5. Right wrist pain Pain and swelling in wrist worsening in the last few weeks with no improvement with splint.  - Ambulatory referral to Physical Therapy   Preterm labor symptoms and general obstetric precautions including but not limited to vaginal bleeding, contractions, leaking of fluid and fetal movement were reviewed in detail with the patient. Please refer to After Visit Summary for other counseling recommendations.   Return in about 4 weeks (around 10/15/2018) for OB visit (MD).  Future Appointments  Date Time Provider Department Center  09/17/2018 10:15 AM Conan Bowens, MD [facility] None  10/05/2018 11:30 AM [facility] NURSE [facility]-US  10/05/2018 11:30 AM [facility] [place] 5 [facility]    Conan Bowens, MD

## 2018-09-18 LAB — GLUCOSE TOLERANCE, 2 HOURS W/ 1HR
GLUCOSE, FASTING: 89 mg/dL (ref 65–91)
Glucose, 1 hour: 146 mg/dL (ref 65–179)
Glucose, 2 hour: 122 mg/dL (ref 65–152)

## 2018-09-18 LAB — CBC
HEMATOCRIT: 27 % — AB (ref 34.0–46.6)
Hemoglobin: 8.5 g/dL — ABNORMAL LOW (ref 11.1–15.9)
MCH: 24.6 pg — ABNORMAL LOW (ref 26.6–33.0)
MCHC: 31.5 g/dL (ref 31.5–35.7)
MCV: 78 fL — ABNORMAL LOW (ref 79–97)
Platelets: 584 10*3/uL — ABNORMAL HIGH (ref 150–450)
RBC: 3.46 x10E6/uL — ABNORMAL LOW (ref 3.77–5.28)
RDW: 15.2 % (ref 11.7–15.4)
WBC: 10.6 10*3/uL (ref 3.4–10.8)

## 2018-09-18 LAB — HIV ANTIBODY (ROUTINE TESTING W REFLEX): HIV Screen 4th Generation wRfx: NONREACTIVE

## 2018-09-18 LAB — RPR: RPR Ser Ql: NONREACTIVE

## 2018-09-20 NOTE — Addendum Note (Signed)
Addended by: Leroy Libman on: 09/20/2018 11:15 AM   Modules accepted: Orders

## 2018-09-22 ENCOUNTER — Other Ambulatory Visit: Payer: Self-pay | Admitting: Obstetrics

## 2018-09-30 ENCOUNTER — Other Ambulatory Visit: Payer: Self-pay

## 2018-09-30 ENCOUNTER — Encounter (HOSPITAL_COMMUNITY)
Admission: RE | Admit: 2018-09-30 | Discharge: 2018-09-30 | Disposition: A | Discharge status: Home / Self Care | Payer: Managed Care, Other (non HMO) | Source: Ambulatory Visit | Attending: Obstetrics and Gynecology | Admitting: Obstetrics and Gynecology

## 2018-09-30 DIAGNOSIS — O99013 Anemia complicating pregnancy, third trimester: Secondary | ICD-10-CM | POA: Insufficient documentation

## 2018-09-30 MED ORDER — SODIUM CHLORIDE 0.9 % IV SOLN
510.0000 mg | INTRAVENOUS | Status: DC
  Administered 2018-09-30: 510 mg via INTRAVENOUS
  Filled 2018-09-30: qty 510

## 2018-09-30 NOTE — Discharge Instructions (Signed)

## 2018-10-05 ENCOUNTER — Ambulatory Visit (HOSPITAL_COMMUNITY)
Admission: RE | Admit: 2018-10-05 | Discharge: 2018-10-05 | Disposition: A | Discharge status: Home / Self Care | Payer: Managed Care, Other (non HMO) | Source: Ambulatory Visit | Attending: Obstetrics and Gynecology | Admitting: Obstetrics and Gynecology

## 2018-10-05 ENCOUNTER — Ambulatory Visit (HOSPITAL_COMMUNITY): Payer: Managed Care, Other (non HMO) | Admitting: *Deleted

## 2018-10-05 ENCOUNTER — Encounter (HOSPITAL_COMMUNITY): Payer: Self-pay

## 2018-10-05 ENCOUNTER — Other Ambulatory Visit (HOSPITAL_COMMUNITY): Payer: Self-pay | Admitting: *Deleted

## 2018-10-05 ENCOUNTER — Other Ambulatory Visit: Payer: Self-pay

## 2018-10-05 VITALS — Temp 99.0°F

## 2018-10-05 DIAGNOSIS — O2693 Pregnancy related conditions, unspecified, third trimester: Secondary | ICD-10-CM

## 2018-10-05 DIAGNOSIS — O09523 Supervision of elderly multigravida, third trimester: Secondary | ICD-10-CM

## 2018-10-05 DIAGNOSIS — O409XX0 Polyhydramnios, unspecified trimester, not applicable or unspecified: Secondary | ICD-10-CM | POA: Diagnosis present

## 2018-10-05 DIAGNOSIS — Z362 Encounter for other antenatal screening follow-up: Secondary | ICD-10-CM

## 2018-10-05 DIAGNOSIS — Z3A30 30 weeks gestation of pregnancy: Secondary | ICD-10-CM | POA: Diagnosis not present

## 2018-10-08 ENCOUNTER — Encounter (HOSPITAL_COMMUNITY)
Admission: RE | Admit: 2018-10-08 | Discharge: 2018-10-08 | Disposition: A | Discharge status: Home / Self Care | Payer: Managed Care, Other (non HMO) | Source: Ambulatory Visit | Attending: Obstetrics and Gynecology | Admitting: Obstetrics and Gynecology

## 2018-10-08 ENCOUNTER — Other Ambulatory Visit: Payer: Self-pay

## 2018-10-08 DIAGNOSIS — O99013 Anemia complicating pregnancy, third trimester: Secondary | ICD-10-CM | POA: Diagnosis not present

## 2018-10-08 MED ORDER — SODIUM CHLORIDE 0.9 % IV SOLN
510.0000 mg | INTRAVENOUS | Status: DC
  Administered 2018-10-08: 510 mg via INTRAVENOUS
  Filled 2018-10-08: qty 17

## 2018-10-14 ENCOUNTER — Other Ambulatory Visit: Payer: Self-pay

## 2018-10-14 ENCOUNTER — Encounter (HOSPITAL_COMMUNITY)
Admission: RE | Admit: 2018-10-14 | Discharge: 2018-10-14 | Disposition: A | Discharge status: Home / Self Care | Payer: Managed Care, Other (non HMO) | Source: Ambulatory Visit | Attending: Obstetrics and Gynecology | Admitting: Obstetrics and Gynecology

## 2018-10-14 DIAGNOSIS — O99013 Anemia complicating pregnancy, third trimester: Secondary | ICD-10-CM | POA: Diagnosis not present

## 2018-10-14 MED ORDER — SODIUM CHLORIDE 0.9 % IV SOLN
510.0000 mg | Freq: Once | INTRAVENOUS | Status: AC
  Administered 2018-10-14: 510 mg via INTRAVENOUS
  Filled 2018-10-14: qty 510

## 2018-10-14 MED ORDER — SODIUM CHLORIDE 0.9 % IV SOLN
510.0000 mg | INTRAVENOUS | Status: DC
  Filled 2018-10-14: qty 17

## 2018-10-15 ENCOUNTER — Encounter (HOSPITAL_COMMUNITY): Payer: Managed Care, Other (non HMO)

## 2018-10-15 ENCOUNTER — Encounter: Payer: Managed Care, Other (non HMO) | Admitting: Obstetrics and Gynecology

## 2018-10-19 ENCOUNTER — Ambulatory Visit (INDEPENDENT_AMBULATORY_CARE_PROVIDER_SITE_OTHER): Payer: Managed Care, Other (non HMO) | Admitting: Obstetrics and Gynecology

## 2018-10-19 ENCOUNTER — Encounter: Payer: Self-pay | Admitting: Obstetrics and Gynecology

## 2018-10-19 ENCOUNTER — Other Ambulatory Visit: Payer: Self-pay

## 2018-10-19 DIAGNOSIS — O0990 Supervision of high risk pregnancy, unspecified, unspecified trimester: Secondary | ICD-10-CM

## 2018-10-19 DIAGNOSIS — O09523 Supervision of elderly multigravida, third trimester: Secondary | ICD-10-CM

## 2018-10-19 DIAGNOSIS — Z3A32 32 weeks gestation of pregnancy: Secondary | ICD-10-CM

## 2018-10-19 DIAGNOSIS — O0993 Supervision of high risk pregnancy, unspecified, third trimester: Secondary | ICD-10-CM

## 2018-10-19 DIAGNOSIS — R7612 Nonspecific reaction to cell mediated immunity measurement of gamma interferon antigen response without active tuberculosis: Secondary | ICD-10-CM

## 2018-10-19 NOTE — Progress Notes (Signed)
Webex ROB visit: No complaints today per pt.  Pt does not have Babyscripts nor a blood pressure cuff

## 2018-10-19 NOTE — Progress Notes (Addendum)
     PRENATAL VISIT NOTE TELEHEALTH VIRTUAL OBSTETRICS VISIT ENCOUNTER NOTE  I connected with@ on 10/19/18 at  8:45 AM EDT by Webex at home and verified that I am speaking with the correct person using two identifiers.   I discussed the limitations, risks, security and privacy concerns of performing an evaluation and management service by telephone and the availability of in person appointments. I also discussed with the patient that there may be a patient responsible charge related to this service. The patient expressed understanding and agreed to proceed. Subjective:  Nancy Miles is a 43 y.o. [id number] at [redacted]w[redacted]d being seen today for ongoing prenatal care.  She is currently monitored for the following issues for this high-risk pregnancy and has Supervision of high risk pregnancy, antepartum; AMA (advanced maternal age) multigravida 35+; H/O: C-section; Positive QuantiFERON-TB Gold test; and TB lung, latent on their problem list.  Patient reports no complaints.  Reports fetal movement. Contractions: Not present. Vag. Bleeding: None.  Movement: Present. Denies any contractions, bleeding or leaking of fluid.   The following portions of the patient's history were reviewed and updated as appropriate: allergies, current medications, past family history, past medical history, past social history, past surgical history and problem list.   Objective:  There were no vitals filed for this visit.  Fetal Status:     Movement: Present     General:  Alert, oriented and cooperative. Patient is in no acute distress.  Respiratory: Normal respiratory effort, no problems with respiration noted  Mental Status: Normal mood and affect. Normal behavior. Normal judgment and thought content.  Rest of physical exam deferred due to type of encounter  Assessment and Plan:  Pregnancy: [id number] at [redacted]w[redacted]d 1. Supervision of high risk pregnancy, antepartum Stable BP monitoring reviewed with pt - Babyscripts Schedule  Optimization  2. Multigravida of advanced maternal age in third trimester LR NIPS  3. Prior c section For repeat   Preterm labor symptoms and general obstetric precautions including but not limited to vaginal bleeding, contractions, leaking of fluid and fetal movement were reviewed in detail with the patient. I discussed the assessment and treatment plan with the patient. The patient was provided an opportunity to ask questions and all were answered. The patient agreed with the plan and demonstrated an understanding of the instructions. The patient was advised to call back or seek an in-person office evaluation/go to [facility] at [facility] for any urgent or concerning symptoms. Please refer to After Visit Summary for other counseling recommendations.   I provided 11 minutes of non-face-to-face time during this encounter. Return in about 4 weeks (around 11/16/2018) for OB visit face to face for GBS.  Future Appointments  Date Time Provider Department Center  11/16/2018 10:30 AM [facility] NURSE WH-[facility]-US  11/16/2018 10:30 AM [facility] [place] 5 [facility]-US    Hermina Staggers, MD Center for [facility], [facility]

## 2018-11-16 ENCOUNTER — Encounter (HOSPITAL_COMMUNITY): Payer: Self-pay | Admitting: *Deleted

## 2018-11-16 ENCOUNTER — Ambulatory Visit (HOSPITAL_COMMUNITY)
Admission: RE | Admit: 2018-11-16 | Discharge: 2018-11-16 | Disposition: A | Discharge status: Home / Self Care | Payer: Managed Care, Other (non HMO) | Source: Ambulatory Visit | Attending: Obstetrics and Gynecology | Admitting: Obstetrics and Gynecology

## 2018-11-16 ENCOUNTER — Ambulatory Visit (HOSPITAL_COMMUNITY): Payer: Managed Care, Other (non HMO) | Admitting: *Deleted

## 2018-11-16 ENCOUNTER — Other Ambulatory Visit: Payer: Self-pay

## 2018-11-16 ENCOUNTER — Other Ambulatory Visit (HOSPITAL_COMMUNITY): Payer: Self-pay | Admitting: *Deleted

## 2018-11-16 DIAGNOSIS — O34219 Maternal care for unspecified type scar from previous cesarean delivery: Secondary | ICD-10-CM

## 2018-11-16 DIAGNOSIS — Z362 Encounter for other antenatal screening follow-up: Secondary | ICD-10-CM

## 2018-11-16 DIAGNOSIS — O2693 Pregnancy related conditions, unspecified, third trimester: Secondary | ICD-10-CM | POA: Diagnosis not present

## 2018-11-16 DIAGNOSIS — R7612 Nonspecific reaction to cell mediated immunity measurement of gamma interferon antigen response without active tuberculosis: Secondary | ICD-10-CM

## 2018-11-16 DIAGNOSIS — Z98891 History of uterine scar from previous surgery: Secondary | ICD-10-CM | POA: Insufficient documentation

## 2018-11-16 DIAGNOSIS — Z3A36 36 weeks gestation of pregnancy: Secondary | ICD-10-CM | POA: Diagnosis not present

## 2018-11-16 DIAGNOSIS — O09523 Supervision of elderly multigravida, third trimester: Secondary | ICD-10-CM

## 2018-11-19 ENCOUNTER — Other Ambulatory Visit (HOSPITAL_COMMUNITY)
Admission: RE | Admit: 2018-11-19 | Discharge: 2018-11-19 | Disposition: A | Discharge status: Home / Self Care | Payer: Managed Care, Other (non HMO) | Source: Ambulatory Visit | Attending: Obstetrics and Gynecology | Admitting: Obstetrics and Gynecology

## 2018-11-19 ENCOUNTER — Ambulatory Visit (INDEPENDENT_AMBULATORY_CARE_PROVIDER_SITE_OTHER): Payer: Managed Care, Other (non HMO) | Admitting: Obstetrics and Gynecology

## 2018-11-19 ENCOUNTER — Other Ambulatory Visit: Payer: Self-pay

## 2018-11-19 ENCOUNTER — Encounter: Payer: Self-pay | Admitting: Obstetrics and Gynecology

## 2018-11-19 VITALS — BP 110/74 | HR 101 | Wt 150.1 lb

## 2018-11-19 DIAGNOSIS — O0990 Supervision of high risk pregnancy, unspecified, unspecified trimester: Secondary | ICD-10-CM | POA: Insufficient documentation

## 2018-11-19 DIAGNOSIS — R7612 Nonspecific reaction to cell mediated immunity measurement of gamma interferon antigen response without active tuberculosis: Secondary | ICD-10-CM

## 2018-11-19 DIAGNOSIS — Z98891 History of uterine scar from previous surgery: Secondary | ICD-10-CM

## 2018-11-19 DIAGNOSIS — O09523 Supervision of elderly multigravida, third trimester: Secondary | ICD-10-CM

## 2018-11-19 NOTE — Progress Notes (Signed)
Patient reports fetal movement with occasional pressure. 

## 2018-11-19 NOTE — Progress Notes (Signed)
   PRENATAL VISIT NOTE  Subjective:  Nancy Miles is a 43 y.o. [id number] at [redacted]w[redacted]d being seen today for ongoing prenatal care.  She is currently monitored for the following issues for this high-risk pregnancy and has Supervision of high risk pregnancy, antepartum; AMA (advanced maternal age) multigravida 35+; H/O: C-section; Positive QuantiFERON-TB Gold test; and TB lung, latent on their problem list.  Patient reports somewhat decreased movement, states baby moves overall less than before, but is still moving.  Contractions: Not present. Vag. Bleeding: None.  Movement: Present. Denies leaking of fluid.   The following portions of the patient's history were reviewed and updated as appropriate: allergies, current medications, past family history, past medical history, past social history, past surgical history and problem list.   Objective:   Vitals:   11/19/18 0925  BP: 110/74  Pulse: (!) 101  Weight: 150 lb 1.6 oz (68.1 kg)    Fetal Status: Fetal Heart Rate (bpm): 136   Movement: Present     General:  Alert, oriented and cooperative. Patient is in no acute distress.  Skin: Skin is warm and dry. No rash noted.   Cardiovascular: Normal heart rate noted  Respiratory: Normal respiratory effort, no problems with respiration noted  Abdomen: Soft, gravid, appropriate for gestational age.  Pain/Pressure: Present     Pelvic: Cervical exam deferred        Extremities: Normal range of motion.  Edema: Trace  Mental Status: Normal mood and affect. Normal behavior. Normal judgment and thought content.   Assessment and Plan:  Pregnancy: [id number] at 103w5d  1. Supervision of high risk pregnancy, antepartum - Strep Gp B NAA - Cervicovaginal ancillary only( [place])  2. Multigravida of advanced maternal age in third trimester Weekly testing IOL 39 weeks, reviewed with patient - reviewed kick counts with patient, and importance of coming to hospital with decreased movement, she verbalized  understanding  3. H/O: C-section For TOLAC  4. 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 Please refer to After Visit Summary for other counseling recommendations.   Return in about 1 week (around 11/26/2018) for OB visit (MD), in person.  Future Appointments  Date Time Provider Department Center  11/24/2018  2:30 PM [facility] NURSE WH-MFC MFC-US  11/24/2018  2:30 PM WH-MFC [place] 1 WH-MFCUS MFC-US  11/30/2018 10:30 AM [facility]-MFC NURSE WH-MFC MFC-US  11/30/2018 10:30 AM WH-MFC [place] 1 WH-MFCUS MFC-US    Conan Bowens, MD

## 2018-11-21 LAB — STREP GP B NAA: Strep Gp B NAA: NEGATIVE

## 2018-11-22 LAB — CERVICOVAGINAL ANCILLARY ONLY
Chlamydia: NEGATIVE
Neisseria Gonorrhea: NEGATIVE

## 2018-11-24 ENCOUNTER — Ambulatory Visit (HOSPITAL_COMMUNITY): Payer: Managed Care, Other (non HMO)

## 2018-11-24 ENCOUNTER — Ambulatory Visit (HOSPITAL_COMMUNITY): Payer: Managed Care, Other (non HMO) | Attending: Obstetrics and Gynecology

## 2018-11-24 ENCOUNTER — Encounter (HOSPITAL_COMMUNITY): Payer: Self-pay

## 2018-11-26 ENCOUNTER — Other Ambulatory Visit: Payer: Self-pay

## 2018-11-26 ENCOUNTER — Ambulatory Visit (INDEPENDENT_AMBULATORY_CARE_PROVIDER_SITE_OTHER): Payer: Managed Care, Other (non HMO) | Admitting: Obstetrics & Gynecology

## 2018-11-26 VITALS — BP 118/85 | HR 111 | Wt 149.9 lb

## 2018-11-26 DIAGNOSIS — Z3A37 37 weeks gestation of pregnancy: Secondary | ICD-10-CM

## 2018-11-26 DIAGNOSIS — Z98891 History of uterine scar from previous surgery: Secondary | ICD-10-CM

## 2018-11-26 DIAGNOSIS — O0990 Supervision of high risk pregnancy, unspecified, unspecified trimester: Secondary | ICD-10-CM

## 2018-11-26 DIAGNOSIS — O09523 Supervision of elderly multigravida, third trimester: Secondary | ICD-10-CM

## 2018-11-26 NOTE — Progress Notes (Signed)
Patient reports fetal movement, denies pain. 

## 2018-11-26 NOTE — Patient Instructions (Signed)

## 2018-11-26 NOTE — Progress Notes (Signed)
   PRENATAL VISIT NOTE  Subjective:  Nancy Miles is a 43 y.o. [id number] at [redacted]w[redacted]d being seen today for ongoing prenatal care.  She is currently monitored for the following issues for this high-risk pregnancy and has Supervision of high risk pregnancy, antepartum; AMA (advanced maternal age) multigravida 35+; H/O: C-section; Positive QuantiFERON-TB Gold test; and TB lung, latent on their problem list.  Patient reports no complaints.  Contractions: Not present. Vag. Bleeding: None.  Movement: Present. Denies leaking of fluid.   The following portions of the patient's history were reviewed and updated as appropriate: allergies, current medications, past family history, past medical history, past social history, past surgical history and problem list.   Objective:   Vitals:   11/26/18 0853  BP: 118/85  Pulse: (!) 111  Weight: 149 lb 14.4 oz (68 kg)    Fetal Status: Fetal Heart Rate (bpm): 140 Fundal Height: 37 cm Movement: Present     General:  Alert, oriented and cooperative. Patient is in no acute distress.  Skin: Skin is warm and dry. No rash noted.   Cardiovascular: Normal heart rate noted  Respiratory: Normal respiratory effort, no problems with respiration noted  Abdomen: Soft, gravid, appropriate for gestational age.  Pain/Pressure: Absent     Pelvic: Cervical exam deferred        Extremities: Normal range of motion.  Edema: Trace  Mental Status: Normal mood and affect. Normal behavior. Normal judgment and thought content.   Assessment and Plan:  Pregnancy: [id number] at [redacted]w[redacted]d 1. Supervision of high risk pregnancy, antepartum Out of work maternity leave, extensive discussion about the process, I gave reassurance, [place] interpreter assisted  2. Multigravida of advanced maternal age in third trimester IOL 40 weeks  3. H/O: C-section VBAC x2  Term labor symptoms and general obstetric precautions including but not limited to vaginal bleeding, contractions, leaking of fluid and  fetal movement were reviewed in detail with the patient. Please refer to After Visit Summary for other counseling recommendations.   Return in about 1 week (around 12/03/2018) for 6/14 maternity leave starts, 39 wk.  Future Appointments  Date Time Provider Department Center  11/30/2018 10:30 AM [facility]-MFC NURSE WH-MFC MFC-US  11/30/2018 10:30 AM WH-MFC [place] 1 WH-MFCUS MFC-US    Scheryl Darter, MD

## 2018-11-30 ENCOUNTER — Other Ambulatory Visit: Payer: Self-pay

## 2018-11-30 ENCOUNTER — Ambulatory Visit (HOSPITAL_COMMUNITY)
Admission: RE | Admit: 2018-11-30 | Discharge: 2018-11-30 | Disposition: A | Discharge status: Home / Self Care | Payer: Managed Care, Other (non HMO) | Source: Ambulatory Visit | Attending: Obstetrics and Gynecology | Admitting: Obstetrics and Gynecology

## 2018-11-30 ENCOUNTER — Other Ambulatory Visit (HOSPITAL_COMMUNITY): Payer: Self-pay | Admitting: *Deleted

## 2018-11-30 ENCOUNTER — Encounter (HOSPITAL_COMMUNITY): Payer: Self-pay

## 2018-11-30 ENCOUNTER — Ambulatory Visit (HOSPITAL_COMMUNITY): Payer: Managed Care, Other (non HMO) | Admitting: *Deleted

## 2018-11-30 VITALS — BP 123/80 | HR 100 | Temp 98.1°F

## 2018-11-30 DIAGNOSIS — Z3A38 38 weeks gestation of pregnancy: Secondary | ICD-10-CM

## 2018-11-30 DIAGNOSIS — O09523 Supervision of elderly multigravida, third trimester: Secondary | ICD-10-CM | POA: Diagnosis not present

## 2018-11-30 DIAGNOSIS — O2693 Pregnancy related conditions, unspecified, third trimester: Secondary | ICD-10-CM | POA: Diagnosis not present

## 2018-11-30 DIAGNOSIS — Z362 Encounter for other antenatal screening follow-up: Secondary | ICD-10-CM | POA: Diagnosis not present

## 2018-11-30 DIAGNOSIS — O34219 Maternal care for unspecified type scar from previous cesarean delivery: Secondary | ICD-10-CM | POA: Diagnosis not present

## 2018-11-30 DIAGNOSIS — O403XX0 Polyhydramnios, third trimester, not applicable or unspecified: Secondary | ICD-10-CM

## 2018-12-03 ENCOUNTER — Other Ambulatory Visit: Payer: Self-pay | Admitting: Obstetrics & Gynecology

## 2018-12-03 ENCOUNTER — Telehealth: Payer: Self-pay

## 2018-12-03 ENCOUNTER — Encounter: Payer: Self-pay | Admitting: Obstetrics & Gynecology

## 2018-12-03 ENCOUNTER — Other Ambulatory Visit: Payer: Self-pay

## 2018-12-03 ENCOUNTER — Other Ambulatory Visit: Payer: Self-pay | Admitting: Advanced Practice Midwife

## 2018-12-03 ENCOUNTER — Inpatient Hospital Stay (HOSPITAL_COMMUNITY)
Admission: AD | Admit: 2018-12-03 | Discharge: 2018-12-03 | Disposition: A | Discharge status: Home / Self Care | Payer: Managed Care, Other (non HMO) | Source: Home / Self Care | Attending: Obstetrics & Gynecology | Admitting: Obstetrics & Gynecology

## 2018-12-03 ENCOUNTER — Ambulatory Visit (INDEPENDENT_AMBULATORY_CARE_PROVIDER_SITE_OTHER): Payer: Managed Care, Other (non HMO) | Admitting: Obstetrics & Gynecology

## 2018-12-03 VITALS — BP 117/74 | HR 96 | Wt 156.0 lb

## 2018-12-03 DIAGNOSIS — O0990 Supervision of high risk pregnancy, unspecified, unspecified trimester: Secondary | ICD-10-CM

## 2018-12-03 DIAGNOSIS — O09523 Supervision of elderly multigravida, third trimester: Secondary | ICD-10-CM

## 2018-12-03 DIAGNOSIS — K0510 Chronic gingivitis, plaque induced: Secondary | ICD-10-CM

## 2018-12-03 DIAGNOSIS — Z3A38 38 weeks gestation of pregnancy: Secondary | ICD-10-CM

## 2018-12-03 DIAGNOSIS — Z3483 Encounter for supervision of other normal pregnancy, third trimester: Secondary | ICD-10-CM

## 2018-12-03 DIAGNOSIS — Z1159 Encounter for screening for other viral diseases: Secondary | ICD-10-CM | POA: Insufficient documentation

## 2018-12-03 MED ORDER — AMPICILLIN 500 MG PO CAPS: 500.0000 mg | ORAL_CAPSULE | Freq: Three times a day (TID) | ORAL | 0 refills | Status: DC

## 2018-12-03 NOTE — MAU Note (Signed)
Asymptomatic, swab collected without problem.  Pt add on induction for Sunday

## 2018-12-03 NOTE — Patient Instructions (Signed)
Labor Induction    Labor induction is when steps are taken to cause a pregnant woman to begin the labor process. Most women go into labor on their own between 37 weeks and 42 weeks of pregnancy. When this does not happen or when there is a medical need for labor to begin, steps may be taken to induce labor. Labor induction causes a pregnant woman's uterus to contract. It also causes the cervix to soften (ripen), open (dilate), and thin out (efface). Usually, labor is not induced before 39 weeks of pregnancy unless there is a medical reason to do so. Your health care provider will determine if labor induction is needed.  Before inducing labor, your health care provider will consider a number of factors, including:  · Your medical condition and your baby's.  · How many weeks along you are in your pregnancy.  · How mature your baby's lungs are.  · The condition of your cervix.  · The position of your baby.  · The size of your birth canal.  What are some reasons for labor induction?  Labor may be induced if:  · Your health or your baby's health is at risk.  · Your pregnancy is overdue by 1 week or more.  · Your water breaks but labor does not start on its own.  · There is a low amount of amniotic fluid around your baby.  You may also choose (elect) to have labor induced at a certain time. Generally, elective labor induction is done no earlier than 39 weeks of pregnancy.  What methods are used for labor induction?  Methods used for labor induction include:  · Prostaglandin medicine. This medicine starts contractions and causes the cervix to dilate and ripen. It can be taken by mouth (orally) or by being inserted into the vagina (suppository).  · Inserting a small, thin tube (catheter) with a balloon into the vagina and then expanding the balloon with water to dilate the cervix.  · Stripping the membranes. In this method, your health care provider gently separates amniotic sac tissue from the cervix. This causes the  cervix to stretch, which in turn causes the release of a hormone called progesterone. The hormone causes the uterus to contract. This procedure is often done during an office visit, after which you will be sent home to wait for contractions to begin.  · Breaking the water. In this method, your health care provider uses a small instrument to make a small hole in the amniotic sac. This eventually causes the amniotic sac to break. Contractions should begin after a few hours.  · Medicine to trigger or strengthen contractions. This medicine is given through an IV that is inserted into a vein in your arm.  Except for membrane stripping, which can be done in a clinic, labor induction is done in the hospital so that you and your baby can be carefully monitored.  How long does it take for labor to be induced?  The length of time it takes to induce labor depends on how ready your body is for labor. Some inductions can take up to 2-3 days, while others may take less than a day. Induction may take longer if:  · You are induced early in your pregnancy.  · It is your first pregnancy.  · Your cervix is not ready.  What are some risks associated with labor induction?  Some risks associated with labor induction include:  · Changes in fetal heart rate, such as being too   high, too low, or irregular (erratic).  · Failed induction.  · Infection in the mother or the baby.  · Increased risk of having a cesarean delivery.  · Fetal death.  · Breaking off (abruption) of the placenta from the uterus (rare).  · Rupture of the uterus (very rare).  When induction is needed for medical reasons, the benefits of induction generally outweigh the risks.  What are some reasons for not inducing labor?  Labor induction should not be done if:  · Your baby does not tolerate contractions.  · You have had previous surgeries on your uterus, such as a myomectomy, removal of fibroids, or a vertical scar from a previous cesarean delivery.  · Your placenta lies  very low in your uterus and blocks the opening of the cervix (placenta previa).  · Your baby is not in a head-down position.  · The umbilical cord drops down into the birth canal in front of the baby.  · There are unusual circumstances, such as the baby being very early (premature).  · You have had more than 2 previous cesarean deliveries.  Summary  · Labor induction is when steps are taken to cause a pregnant woman to begin the labor process.  · Labor induction causes a pregnant woman's uterus to contract. It also causes the cervix to ripen, dilate, and efface.  · Labor is not induced before 39 weeks of pregnancy unless there is a medical reason to do so.  · When induction is needed for medical reasons, the benefits of induction generally outweigh the risks.  This information is not intended to replace advice given to you by your health care provider. Make sure you discuss any questions you have with your health care provider.  Document Released: 10/29/2006 Document Revised: 07/23/2016 Document Reviewed: 07/23/2016  Elsevier Interactive Patient Education © 2019 Elsevier Inc[employer]

## 2018-12-03 NOTE — Progress Notes (Signed)
   PRENATAL VISIT NOTE  Subjective:  Nancy Miles is a 43 y.o. [id number] at [redacted]w[redacted]d being seen today for ongoing prenatal care.  She is currently monitored for the following issues for this high-risk pregnancy and has Supervision of high risk pregnancy, antepartum; AMA (advanced maternal age) multigravida 35+; H/O: C-section; Positive QuantiFERON-TB Gold test; and TB lung, latent on their problem list.  Patient reports no complaints.  Contractions: Not present. Vag. Bleeding: None.  Movement: Present. Denies leaking of fluid.   The following portions of the patient's history were reviewed and updated as appropriate: allergies, current medications, past family history, past medical history, past social history, past surgical history and problem list.   Objective:   Vitals:   12/03/18 1100  BP: 117/74  Pulse: 96  Weight: 156 lb (70.8 kg)    Fetal Status: Fetal Heart Rate (bpm): 130   Movement: Present     General:  Alert, oriented and cooperative. Patient is in no acute distress.  Skin: Skin is warm and dry. No rash noted.   Cardiovascular: Normal heart rate noted  Respiratory: Normal respiratory effort, no problems with respiration noted  Abdomen: Soft, gravid, appropriate for gestational age.  Pain/Pressure: Present     Pelvic: Cervical exam deferred        Extremities: Normal range of motion.     Mental Status: Normal mood and affect. Normal behavior. Normal judgment and thought content.  Gums - swelling upper gumline gingivitis Assessment and Plan:  Pregnancy: [id number] at [redacted]w[redacted]d 1. Supervision of high risk pregnancy, antepartum   2. Multigravida of advanced maternal age in third trimester IOL 39 weeks  3. Gingivitis Examined today, will treat - ampicillin (PRINCIPEN) 500 MG capsule; Take 1 capsule (500 mg total) by mouth 3 (three) times daily.  Dispense: 21 capsule; Refill: 0  Term labor symptoms and general obstetric precautions including but not limited to vaginal bleeding,  contractions, leaking of fluid and fetal movement were reviewed in detail with the patient. Please refer to After Visit Summary for other counseling recommendations.   Return if symptoms worsen or fail to improve, for postpartum.  Future Appointments  Date Time Provider [place]  12/07/2018  9:40 AM [place] MFC-US  12/07/2018  9:45 AM [place] 2 WH-MFCUS MFC-US    Emeterio Reeve, MD

## 2018-12-03 NOTE — Telephone Encounter (Signed)
Call place to pt to make her aware that she will need to be seen at [facility] today for her pre admit Covid testing.  Pt states understanding.

## 2018-12-04 LAB — NOVEL CORONAVIRUS, NAA (HOSP ORDER, SEND-OUT TO REF LAB; TAT 18-24 HRS): SARS-CoV-2, NAA: NOT DETECTED

## 2018-12-05 ENCOUNTER — Other Ambulatory Visit: Payer: Self-pay

## 2018-12-05 ENCOUNTER — Inpatient Hospital Stay (HOSPITAL_COMMUNITY): Payer: Managed Care, Other (non HMO)

## 2018-12-05 ENCOUNTER — Inpatient Hospital Stay (HOSPITAL_COMMUNITY)
Admission: AD | Admit: 2018-12-05 | Discharge: 2018-12-07 | DRG: 807 | Disposition: A | Discharge status: Home / Self Care | Payer: Managed Care, Other (non HMO) | Attending: Obstetrics & Gynecology | Admitting: Obstetrics & Gynecology

## 2018-12-05 ENCOUNTER — Encounter (HOSPITAL_COMMUNITY): Payer: Self-pay

## 2018-12-05 DIAGNOSIS — Z98891 History of uterine scar from previous surgery: Secondary | ICD-10-CM

## 2018-12-05 DIAGNOSIS — O09523 Supervision of elderly multigravida, third trimester: Secondary | ICD-10-CM

## 2018-12-05 DIAGNOSIS — Z1159 Encounter for screening for other viral diseases: Secondary | ICD-10-CM

## 2018-12-05 DIAGNOSIS — R7612 Nonspecific reaction to cell mediated immunity measurement of gamma interferon antigen response without active tuberculosis: Secondary | ICD-10-CM

## 2018-12-05 DIAGNOSIS — O09529 Supervision of elderly multigravida, unspecified trimester: Secondary | ICD-10-CM

## 2018-12-05 DIAGNOSIS — Z3A39 39 weeks gestation of pregnancy: Secondary | ICD-10-CM

## 2018-12-05 DIAGNOSIS — O34219 Maternal care for unspecified type scar from previous cesarean delivery: Principal | ICD-10-CM | POA: Diagnosis present

## 2018-12-05 DIAGNOSIS — O26893 Other specified pregnancy related conditions, third trimester: Secondary | ICD-10-CM | POA: Diagnosis present

## 2018-12-05 DIAGNOSIS — Z227 Latent tuberculosis: Secondary | ICD-10-CM | POA: Diagnosis present

## 2018-12-05 LAB — CBC
HCT: 36.6 % (ref 36.0–46.0)
Hemoglobin: 12.2 g/dL (ref 12.0–15.0)
MCH: 29.6 pg (ref 26.0–34.0)
MCHC: 33.3 g/dL (ref 30.0–36.0)
MCV: 88.8 fL (ref 80.0–100.0)
Platelets: 347 10*3/uL (ref 150–400)
RBC: 4.12 MIL/uL (ref 3.87–5.11)
RDW: 20.7 % — ABNORMAL HIGH (ref 11.5–15.5)
WBC: 8.3 10*3/uL (ref 4.0–10.5)
nRBC: 0 % (ref 0.0–0.2)

## 2018-12-05 LAB — TYPE AND SCREEN
ABO/RH(D): O POS
Antibody Screen: NEGATIVE

## 2018-12-05 MED ORDER — LACTATED RINGERS IV SOLN: 500.0000 mL | INTRAVENOUS | Status: DC | PRN

## 2018-12-05 MED ORDER — SENNOSIDES-DOCUSATE SODIUM 8.6-50 MG PO TABS
2.0000 | ORAL_TABLET | ORAL | Status: DC
  Administered 2018-12-06 (×2): 2 via ORAL
  Filled 2018-12-05 (×2): qty 2

## 2018-12-05 MED ORDER — DIPHENHYDRAMINE HCL 25 MG PO CAPS: 25.0000 mg | ORAL_CAPSULE | Freq: Four times a day (QID) | ORAL | Status: DC | PRN

## 2018-12-05 MED ORDER — TETANUS-DIPHTH-ACELL PERTUSSIS 5-2.5-18.5 LF-MCG/0.5 IM SUSP: 0.5000 mL | Freq: Once | INTRAMUSCULAR | Status: DC

## 2018-12-05 MED ORDER — FENTANYL CITRATE (PF) 100 MCG/2ML IJ SOLN
100.0000 ug | INTRAMUSCULAR | Status: DC | PRN
  Administered 2018-12-05 (×3): 100 ug via INTRAVENOUS
  Filled 2018-12-05 (×3): qty 2

## 2018-12-05 MED ORDER — ACETAMINOPHEN 325 MG PO TABS: 650.0000 mg | ORAL_TABLET | ORAL | Status: DC | PRN

## 2018-12-05 MED ORDER — LACTATED RINGERS IV SOLN
INTRAVENOUS | Status: DC
  Administered 2018-12-05 (×2): via INTRAVENOUS

## 2018-12-05 MED ORDER — OXYTOCIN BOLUS FROM INFUSION
500.0000 mL | Freq: Once | INTRAVENOUS | Status: AC
  Administered 2018-12-05: 500 mL via INTRAVENOUS

## 2018-12-05 MED ORDER — TERBUTALINE SULFATE 1 MG/ML IJ SOLN: 0.2500 mg | Freq: Once | INTRAMUSCULAR | Status: DC | PRN

## 2018-12-05 MED ORDER — OXYCODONE HCL 5 MG PO TABS: 5.0000 mg | ORAL_TABLET | ORAL | Status: DC | PRN

## 2018-12-05 MED ORDER — OXYCODONE-ACETAMINOPHEN 5-325 MG PO TABS: 1.0000 | ORAL_TABLET | ORAL | Status: DC | PRN

## 2018-12-05 MED ORDER — SIMETHICONE 80 MG PO CHEW: 80.0000 mg | CHEWABLE_TABLET | ORAL | Status: DC | PRN

## 2018-12-05 MED ORDER — OXYTOCIN 40 UNITS IN NORMAL SALINE INFUSION - SIMPLE MED: 2.5000 [IU]/h | INTRAVENOUS | Status: DC

## 2018-12-05 MED ORDER — IBUPROFEN 600 MG PO TABS
600.0000 mg | ORAL_TABLET | Freq: Four times a day (QID) | ORAL | Status: DC
  Administered 2018-12-06 – 2018-12-07 (×8): 600 mg via ORAL
  Filled 2018-12-05 (×8): qty 1

## 2018-12-05 MED ORDER — BENZOCAINE-MENTHOL 20-0.5 % EX AERO
1.0000 "application " | INHALATION_SPRAY | CUTANEOUS | Status: DC | PRN
  Administered 2018-12-06: 1 via TOPICAL
  Filled 2018-12-05: qty 56

## 2018-12-05 MED ORDER — OXYCODONE-ACETAMINOPHEN 5-325 MG PO TABS: 2.0000 | ORAL_TABLET | ORAL | Status: DC | PRN

## 2018-12-05 MED ORDER — PRENATAL MULTIVITAMIN CH
1.0000 | ORAL_TABLET | Freq: Every day | ORAL | Status: DC
  Administered 2018-12-06 – 2018-12-07 (×2): 1 via ORAL
  Filled 2018-12-05 (×2): qty 1

## 2018-12-05 MED ORDER — ONDANSETRON HCL 4 MG/2ML IJ SOLN: 4.0000 mg | INTRAMUSCULAR | Status: DC | PRN

## 2018-12-05 MED ORDER — ONDANSETRON HCL 4 MG/2ML IJ SOLN: 4.0000 mg | Freq: Four times a day (QID) | INTRAMUSCULAR | Status: DC | PRN

## 2018-12-05 MED ORDER — COCONUT OIL OIL: 1.0000 "application " | TOPICAL_OIL | Status: DC | PRN

## 2018-12-05 MED ORDER — LIDOCAINE HCL (PF) 1 % IJ SOLN: 30.0000 mL | INTRAMUSCULAR | Status: DC | PRN

## 2018-12-05 MED ORDER — ONDANSETRON HCL 4 MG PO TABS: 4.0000 mg | ORAL_TABLET | ORAL | Status: DC | PRN

## 2018-12-05 MED ORDER — OXYCODONE HCL 5 MG PO TABS: 10.0000 mg | ORAL_TABLET | ORAL | Status: DC | PRN

## 2018-12-05 MED ORDER — OXYTOCIN 40 UNITS IN NORMAL SALINE INFUSION - SIMPLE MED
1.0000 m[IU]/min | INTRAVENOUS | Status: DC
  Administered 2018-12-05: 2 m[IU]/min via INTRAVENOUS
  Filled 2018-12-05: qty 1000

## 2018-12-05 MED ORDER — DIBUCAINE (PERIANAL) 1 % EX OINT: 1.0000 "application " | TOPICAL_OINTMENT | CUTANEOUS | Status: DC | PRN

## 2018-12-05 MED ORDER — WITCH HAZEL-GLYCERIN EX PADS: 1.0000 "application " | MEDICATED_PAD | CUTANEOUS | Status: DC | PRN

## 2018-12-05 MED ORDER — SOD CITRATE-CITRIC ACID 500-334 MG/5ML PO SOLN: 30.0000 mL | ORAL | Status: DC | PRN

## 2018-12-05 MED ORDER — ZOLPIDEM TARTRATE 5 MG PO TABS: 5.0000 mg | ORAL_TABLET | Freq: Every evening | ORAL | Status: DC | PRN

## 2018-12-05 NOTE — Discharge Summary (Signed)
Postpartum Discharge Summary     Patient Name: Nancy Miles DOB: Apr 11, 1976 MRN: [id number]  Date of admission: 12/05/2018 Delivering Provider: Lajean Manes   Date of discharge: 12/07/2018  Admitting diagnosis: pregnancy Intrauterine pregnancy: [redacted]w[redacted]d     Secondary diagnosis:  Principal Problem:   AMA (advanced maternal age) multigravida 35+ Active Problems:   H/O: C-section   TB lung, latent   VBAC, delivered  Additional problems: none     Discharge diagnosis: Term Pregnancy Delivered, VBAC and AMA, Latent TB                                                                                                Post partum procedures:none  Augmentation: AROM, Pitocin and Foley Balloon  Complications: None  Hospital course:  Induction of Labor With Vaginal Delivery   43 y.o. yo [phone] at [redacted]w[redacted]d was admitted to the hospital 12/05/2018 for induction of labor.  Indication for induction: AMA.  Patient had an uncomplicated labor course as follows: Membrane Rupture Time/Date: 3:53 PM ,12/05/2018   Intrapartum Procedures: Episiotomy: None [1]                                         Lacerations:  None [1]  Patient had delivery of a Viable infant.  Information for the patient's newborn:  Micayla, [place] [[id number]]  Delivery Method: Vag-Spont(filed from delivery)    12/05/2018  Details of delivery can be found in separate delivery note.  Patient had a routine postpartum course. Patient is discharged home 12/07/18.  Magnesium Sulfate recieved: No BMZ received: No  Physical exam  Vitals:   12/06/18 1300 12/06/18 1500 12/06/18 2149 12/07/18 0538  BP: (!) 105/57 98/65 111/82 112/76  Pulse: 88 85 89 84  Resp: 18 18 14 16   Temp: 99 F (37.2 C) 98.2 F (36.8 C) 98.4 F (36.9 C) 98.1 F (36.7 C)  TempSrc: Oral Oral Oral Oral  SpO2: 100% 99% 100% 99%  Weight:      Height:       General: alert and cooperative Lochia: appropriate Uterine Fundus: firm Incision: N/A DVT  Evaluation: No evidence of DVT seen on physical exam. Labs: Lab Results  Component Value Date   WBC 8.3 12/05/2018   HGB 12.2 12/05/2018   HCT 36.6 12/05/2018   MCV 88.8 12/05/2018   PLT 347 12/05/2018   CMP Latest Ref Rng & Units 05/27/2018  Glucose 70 - 99 mg/dL 94  BUN 6 - 20 mg/dL <5(L)  Creatinine 0.44 - 1.00 mg/dL 0.48  Sodium 135 - 145 mmol/L 134(L)  Potassium 3.5 - 5.1 mmol/L 4.0  Chloride 98 - 111 mmol/L 103  CO2 22 - 32 mmol/L 22  Calcium 8.9 - 10.3 mg/dL 9.3  Total Protein 6.5 - 8.1 g/dL 6.7  Total Bilirubin 0.3 - 1.2 mg/dL 0.8  Alkaline Phos 38 - 126 U/L 60  AST 15 - 41 U/L 27  ALT 0 - 44 U/L 26    Discharge instruction: per After Visit  Summary and "Baby and Me Booklet".  After visit meds:  Allergies as of 12/07/2018   No Known Allergies     Medication List    TAKE these medications   ampicillin 500 MG capsule Commonly known as: PRINCIPEN Take 1 capsule (500 mg total) by mouth 3 (three) times daily.   Butalbital-APAP-Caffeine 50-325-40 MG capsule Take 1-2 capsules by mouth every 6 (six) hours as needed for headache.   famotidine 20 MG tablet Commonly known as: PEPCID Take 1 tablet (20 mg total) by mouth 2 (two) times daily.   ibuprofen 600 MG tablet Commonly known as: ADVIL Take 1 tablet (600 mg total) by mouth every 6 (six) hours.   ondansetron 8 MG disintegrating tablet Commonly known as: ZOFRAN-ODT Take 1 tablet (8 mg total) by mouth every 8 (eight) hours as needed for nausea or vomiting.   senna-docusate 8.6-50 MG tablet Commonly known as: Senokot-S Take 2 tablets by mouth daily. Start taking on: December 08, 2018   Vitafol Gummies 3.33-0.333-34.8 MG Chew Chew 1 tablet by mouth daily.       Diet: routine diet  Activity: Advance as tolerated. Pelvic rest for 6 weeks.   Outpatient follow up:4 weeks Follow up Appt: Future Appointments  Date Time Provider Department Center  01/07/2019 11:00 AM Constant, Gigi GinPeggy, MD [facility] None   Follow  up Visit: Follow-up Information    [facility] Go on 01/07/2019.   Specialty: Obstetrics and Gynecology Why: At 11 am with Dr. Jolayne Pantheronstant for postpartum follow up  Contact information: [address], [address] [place] [id number] [phone]           Please schedule this patient for Postpartum visit in: 4 weeks with the following provider: Any provider For C/S patients schedule nurse incision check in weeks 2 weeks: no High risk pregnancy complicated by: Hx of C/S, AMA Delivery mode:  VBAC Anticipated Birth Control:  Declines contraception PP Procedures needed: none  Schedule Integrated BH visit: no  (postpartum message sent to office on 6/14)  Newborn Data: Live born female  Birth Weight: 3315 g APGAR: 8 , 9  Newborn Delivery   Birth date/time: 12/05/2018 21:50:00 Delivery type: Vaginal, Spontaneous      Baby Feeding: Bottle and Breast Disposition:home with mother   12/07/2018 De Hollingsheadatherine L , DO

## 2018-12-05 NOTE — Progress Notes (Signed)
OB/GYN Faculty Practice: Labor Progress Note  Subjective: Strip note. Plan of care discussed with RN - patient continuing to move around the room in different positions. Feeling uncomfortable with regular contractions.   Objective: BP (!) 110/45   Pulse 98   Temp 98.2 F (36.8 C) (Oral)   Resp 18   Ht 5\' 1"  (1.549 m)   Wt 70.8 kg   LMP 03/07/2018   BMI 29.48 kg/m  Gen: strip note Dilation: 6 Effacement (%): 90 Cervical Position: Middle Station: -2 Presentation: Vertex Exam by:: katherine g jones RN   Assessment and Plan: 43 y.o. [id number] [redacted]w[redacted]d here for IOL for AMA.  Labor: Progressing well. Continue pitocin, titrate per protocol prn. Could consider placement of IUPC with next check depending on progress.  -- pain control: open to options -- PPH Risk: medium  Fetal Wellbeing: EFW 79% (3102g) at 36w2. Cephalic by sutures on RN check .  -- GBS (negative) -- continuous fetal monitoring - category II - does have occasional early decelerations but with excellent variability in between, will continue to monitor closely   Sarabeth Benton S. Juleen China, DO OB/GYN Fellow, Faculty Practice  7:13 PM

## 2018-12-05 NOTE — Progress Notes (Signed)
OB/GYN Faculty Practice: Labor Progress Note  Subjective: Doing well, starting to feel some contractions.  Objective: BP (!) 144/94   Pulse 91   Temp 98.5 F (36.9 C) (Oral)   Resp 18   Ht 5\' 1"  (1.549 m)   Wt 70.8 kg   LMP 03/07/2018   BMI 29.48 kg/m  Gen: well-appearing, NAD Dilation: 1 Effacement (%): Thick Cervical Position: Posterior Station: Ballotable Presentation: Vertex Exam by:: Dr. Juleen China  Assessment and Plan: 43 y.o. [id number] [redacted]w[redacted]d here for IOL for AMA.  Labor: Pitocin now at 6. FB placed without difficulty, good contraction pattern. Will keep FB at 6 until FB out.  -- pain control: open to options -- PPH Risk: medium  Fetal Wellbeing: EFW 79% (3102g) at 36w2. Cephalic by sutures on RN check .  -- GBS (negative) -- continuous fetal monitoring - category I   Laurel S. Juleen China, DO OB/GYN Fellow, Faculty Practice  11:34 AM

## 2018-12-05 NOTE — Progress Notes (Signed)
Labor Progress Note  Subjective: Called to patient room by RN. Pt 8/100/0,+1 and involuntarily pushing.   Objective: BP (!) 126/48   Pulse 96   Temp 98.4 F (36.9 C) (Oral)   Resp 18   Ht 5\' 1"  (1.549 m)   Wt 70.8 kg   LMP 03/07/2018   BMI 29.48 kg/m  Gen: alert, well-appearing Dilation: 8 Effacement (%): 100 Cervical Position: Anterior Station: 0 Presentation: Vertex Exam by:: cwhite,rnc  Assessment and Plan: 43 y.o. [id number] [redacted]w[redacted]d admitted for IOL for AMA. Pt is a TOLAC with history of 2 successful VBACs.   Labor:  -- Patient has reducible lip on SNM exam -- Patient pushed for 30 minutes in various positions with no change in fetal station. -- Patient to rest with peanut ball -- Pain control: none -- PPH Risk: high  Fetal Well-Being:  -- Cephalic by cervical exam.  -- Category 2; FHR 125, moderate variability, +accels, +variable decels -- GBS negative  Maryagnes Amos, SNM 9:27 PM

## 2018-12-05 NOTE — Progress Notes (Signed)
10 Instruments 5 9*9 5 4*18 2 Injectables  

## 2018-12-05 NOTE — Progress Notes (Signed)
OB/GYN Faculty Practice: Labor Progress Note  Subjective: Doing well, feeling uncomfortable with contractions.   Objective: BP 114/68   Pulse 81   Temp 98.4 F (36.9 C)   Resp 18   Ht 5\' 1"  (1.549 m)   Wt 70.8 kg   LMP 03/07/2018   BMI 29.48 kg/m  Gen: sitting on birthing ball Dilation: 5 Effacement (%): 70 Cervical Position: Middle Station: -2 Presentation: Vertex Exam by:: dr. Juleen China  Assessment and Plan: 43 y.o. A4S9753 [redacted]w[redacted]d here for IOL for AMA.  Labor: FB out. Continue to titrate pitocin per protocol, contractions seem to be getting more regular. Counseled on use of AROM to help with labor progress, including risks/benefits. Patient amenable to AROM - performed without complication, clear fluid.  -- pain control: open to options -- PPH Risk: medium  Fetal Wellbeing: EFW 79% (3102g) at 36w2. Cephalic by sutures on RN check .  -- GBS (negative) -- continuous fetal monitoring - category I   Dee Maday S. Juleen China, DO OB/GYN Fellow, Faculty Practice  4:34 PM

## 2018-12-05 NOTE — H&P (Signed)
OBSTETRIC ADMISSION HISTORY AND PHYSICAL  Nancy Miles is a 43 y.o. female [phone] with IUP at 5024w0d by L/19 presenting for induction of labor for AMA.   Reports fetal movement. Denies vaginal bleeding, leakage of fluids.  She received her prenatal care at [facility].  Support person in labor: Husband, FOB  Ultrasounds . 19w1: posterior fundal placenta, EFW 58%, no fetal anomalies identified with some limited views . 23w4: normal interval growth, completion of anatomy U/S, EICF . 30w2: EFW 1833g (78%), normal AFI . 36w2: EFW 3102g (79%), BPP 8/8   Prenatal History/Complications: . History of latent TB - treated with Rifampin  Past Medical History: Past Medical History:  Diagnosis Date  . Headache   . Supervision of high risk pregnancy, antepartum 06/11/2018   .Marland Kitchen. Nursing Staff Provider Office Location [facility] Dating  LMP Language   English & Trigrinya Anatomy US   Flu Vaccine   Genetic Screen  NIPS:  Low risk   TDaP vaccine    Hgb A1C or  GTT Early  Third trimester  Rhogam     LAB RESULTS  Feeding Plan Breast/Bottle Blood Type O/Positive/-- (12/20 0959)  Contraception Undecided Antibody Negative (12/20 0959) Circumcision  Rubella 3.16 (12/20 0959) Pe  . 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 CESAREAN SECTION     transverse lie    Obstetrical History: OB History    Gravida  6   Para  3   Term  3   Preterm      AB  2   Living  3     SAB  2   TAB      Ectopic      Multiple      Live Births  3           Social History: Social History   Socioeconomic History  . Marital status: Married    Spouse name: Not on file  . Number of children: Not on file  . Years of education: Not on file  . Highest education level: Not on file  Occupational History  . Not on file  Social Needs  . Financial resource strain: Not on file  . Food insecurity    Worry: Patient refused    Inability: Patient  refused  . Transportation needs    Medical: Patient refused    Non-medical: Patient refused  Tobacco Use  . Smoking status: Never Smoker  . Smokeless tobacco: Never Used  Substance and Sexual Activity  . Alcohol use: No  . Drug use: No  . Sexual activity: Yes  Lifestyle  . Physical activity    Days per week: Not on file    Minutes per session: Not on file  . Stress: Not on file  Relationships  . Social Musicianconnections    Talks on phone: Not on file    Gets together: Not on file    Attends religious service: Not on file    Active member of club or organization: Not on file    Attends meetings of clubs or organizations: Not on file    Relationship status: Not on file  Other Topics Concern  . Not on file  Social History Narrative  . Not on file    Family History: Family History  Problem Relation Age of Onset  . Healthy Mother   . Other Neg Hx     Allergies: No Known Allergies  Medications Prior to Admission  Medication Sig Dispense Refill Last Dose  . ampicillin (PRINCIPEN) 500 MG capsule Take 1 capsule (500 mg total) by mouth 3 (three) times daily. 21 capsule 0   . Butalbital-APAP-Caffeine 50-325-40 MG capsule Take 1-2 capsules by mouth every 6 (six) hours as needed for headache. 30 capsule 0   . famotidine (PEPCID) 20 MG tablet Take 1 tablet (20 mg total) by mouth 2 (two) times daily. 60 tablet 3   . ondansetron (ZOFRAN-ODT) 8 MG disintegrating tablet Take 1 tablet (8 mg total) by mouth every 8 (eight) hours as needed for nausea or vomiting. 30 tablet 2   . Prenatal Vit-Fe Phos-FA-Omega (VITAFOL GUMMIES) 3.33-0.333-34.8 MG CHEW Chew 1 tablet by mouth daily. 90 tablet 5      Review of Systems  All systems reviewed and negative except as stated in HPI  Blood pressure 106/67, pulse 99, temperature 98.5 F (36.9 C), temperature source Oral, resp. rate 18, height 5\' 1"  (1.549 m), weight 70.8 kg, last menstrual period 03/07/2018. General appearance: alert, well-appearing,  NAD Lungs: no respiratory distress Heart: regular rate  Abdomen: soft, non-tender; gravid  Pelvic: deferred Extremities: no significant LE edema  Presentation: cephalic by sutures on RN check Fetal monitoring: 140s/mod/+a/-d Uterine activity: irritability  Dilation: 1 Effacement (%): Thick Station: Ballotable Exam by:: Normand Sloopk. jones RN   Prenatal labs: ABO, Rh: --/--/O POS (06/14 0831) Antibody: NEG (06/14 0831) Rubella: 3.16 (12/20 0959) RPR: Non Reactive (03/27 0923)  HBsAg: Negative (12/20 0959)  HIV: Non Reactive (03/27 0923)  GBS: Negative (05/29 1024)  Glucola: normal 2-hr Genetic screening:  Low risk NIPS  negative AFP screen   Prenatal Transfer Tool  Maternal Diabetes: No Genetic Screening: Normal Maternal Ultrasounds/Referrals: Normal - EICF but low risk NIPS Fetal Ultrasounds or other Referrals:  None Maternal Substance Abuse:  No Significant Maternal Medications:  None Significant Maternal Lab Results: None  Results for orders placed or performed during the hospital encounter of 12/05/18 (from the past 24 hour(s))  CBC   Collection Time: 12/05/18  8:31 AM  Result Value Ref Range   WBC 8.3 4.0 - 10.5 K/uL   RBC 4.12 3.87 - 5.11 MIL/uL   Hemoglobin 12.2 12.0 - 15.0 g/dL   HCT 16.136.6 09.636.0 - 04.546.0 %   MCV 88.8 80.0 - 100.0 fL   MCH 29.6 26.0 - 34.0 pg   MCHC 33.3 30.0 - 36.0 g/dL   RDW 40.920.7 (H) 81.111.5 - 91.415.5 %   Platelets 347 150 - 400 K/uL   nRBC 0.0 0.0 - 0.2 %  Type and screen   Collection Time: 12/05/18  8:31 AM  Result Value Ref Range   ABO/RH(D) O POS    Antibody Screen NEG    Sample Expiration      12/08/2018,2359 Performed at [facility], [address] [place], [place] [address]     Patient Active Problem List   Diagnosis Date Noted  . 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 Supervision of high risk pregnancy, antepartum 06/11/2018  . AMA (advanced maternal age) multigravida 35+ 06/11/2018  . H/O:  C-section 06/11/2018    Assessment/Plan:  Nancy Miles is a 43 y.o. [id number] at 7648w0d here for IOL for AMA.   Labor: AMA. TOLAC - consent signed during prenatal care, verified risks/benefits verbally on admission.  -- start pitocin - will do low dose and place FB when able, not to exceed 6-10 until FB out  -- pain control: option to options  Fetal Wellbeing: EFW 79% (3102g) at 36w2. Cephalic by sutures on RN check .  -- GBS (negative) -- continuous fetal monitoring - category I   Postpartum Planning -- breast and bottle/declines contraception -- RI/[x] Tdap   Laurel S. Juleen China, DO OB/GYN Fellow

## 2018-12-06 LAB — RPR: RPR Ser Ql: NONREACTIVE

## 2018-12-06 NOTE — Lactation Note (Signed)
This note was copied from a baby's chart. Lactation Consultation Note  Patient Name: Nancy Miles [id number] Date: 12/06/2018 Reason for consult: Follow-up assessment;Difficult latch;Term  P4 mother whose infant is now 79 hours old.  Mother breast fed her other three children for 2 years each.  Mother's feeding preference is breast/bottle.  RN requested latch assistance.   When I arrived mother was changing the diaper.  I offered to assist with latching and mother accepted.  Mother's breasts are soft and non tender and nipples are everted and intact.  Baby remains very sleepy.  Demonstrated techniques to help awaken baby.  Burped him and he started to arouse.  Assisted to latch in the football position on the left breast.  Once latched, baby would not begin to suck.  Demonstrated gentle stimulation but baby was not interested in sucking.  Removed him from the breast and performed suck training for 5 minutes.  He required cheek and jaw support to begin sucking.  Encouraged mother to assist with suck training prior to latching.  Attempted a few more times after baby continued to burp but he remained disinterested.  Placed him STS on mother's chest.  Encouraged her to call the next time baby shows feeding cues.  RN updated.   Maternal Data Formula Feeding for Exclusion: No Reason for exclusion: Mother's choice to formula and breast feed on admission Has patient been taught Hand Expression?: Yes Does the patient have breastfeeding experience prior to this delivery?: Yes  Feeding Feeding Type: Breast Fed  LATCH Score Latch: Too sleepy or reluctant, no latch achieved, no sucking elicited.  Audible Swallowing: None  Type of Nipple: Everted at rest and after stimulation  Comfort (Breast/Nipple): Soft / non-tender  Hold (Positioning): Assistance needed to correctly position infant at breast and maintain latch.  LATCH Score: 5  Interventions Interventions: Breast feeding basics  reviewed;Assisted with latch;Skin to skin;Breast compression;Adjust position;Position options;Support pillows  Lactation Tools Discussed/Used     Consult Status Consult Status: Follow-up Date: 12/07/18 Follow-up type: In-patient    Nancy Miles 12/06/2018, 7:01 PM

## 2018-12-06 NOTE — Progress Notes (Signed)
Offered patient and FOB an interpreter for admission to postpartum mother/baby unit. Patient and FOB declined and verbalized/demonstrated understanding of all topics reviewed.

## 2018-12-06 NOTE — Progress Notes (Addendum)
Post Partum Day 1  Subjective: Up ad lib, voiding, tolerating PO, and + flatus. Denies dizziness, lightheadedness, tachycardia. Appropriate Lochia. Pain well controlled.  Objective: BP 119/70 (BP Location: Right Arm)   Pulse 87   Temp 99 F (37.2 C) (Oral)   Resp 18   Ht 5\' 1"  (1.549 m)   Wt 70.8 kg   LMP 03/07/2018   SpO2 100%   Breastfeeding Unknown   BMI 29.48 kg/m  Intake/Output      06/14 0701 - 06/15 0700 06/15 0701 - 06/16 0700   I.V. (mL/kg) 273.2 (3.9)    Total Intake(mL/kg) 273.2 (3.9)    Urine (mL/kg/hr) 0    Blood 258    Total Output 258    Net +15.2         Urine Occurrence 1 x      Physical Exam:  General: alert, cooperative, appears stated age, fatigued, and no distress Lungs: CTAB, no crackles or wheezes Heart: RRR, no m/r/r.  Lochia: appropriate Uterine Fundus: firm DVT Evaluation: No evidence of DVT seen on physical exam, extremities warm and well perfused.   Recent Labs    12/05/18 0831  HGB 12.2  HCT 36.6    Assessment/Plan: Postpartum Day # 1  . Plan for d/c tomorrow  . Continue daily lactation  . Contraception: declined.  . 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 Will need urology f/u per peds.    LOS: 1 day   Nancy Miles 12/06/2018, 8:17 AM

## 2018-12-06 NOTE — Lactation Note (Signed)
This note was copied from a baby's chart. Lactation Consultation Note  Patient Name: Nancy Miles [id number] Date: 12/06/2018 Reason for consult: Initial assessment;Term  P4 mother whose infant is now 21 hours old.  Mother breast fed  her other three children for 2 years each.  Mother's feeding preference is breast/bottle.  Baby was asleep in the bassinet when I arrived.  Mother had no immediate questions/concerns related to breast feeding.  Encouraged to feed 8-12 times/24 hours or sooner if baby shows feeding cues.  Reviewed cues.  Taught hand expression and mother did a return demonstration.  She was able to express one colostrum drop from the left breast.  Finger feeding demonstrated.  Mother's breasts are soft and non tender and nipples are everted and intact.  She will call for latch assistance as needed.  Encouraged her not to give any formula while here in the hospital if possible so we can practice latching to help increase milk supply.  Mother verbalized understanding.    Mom made aware of O/P services, breastfeeding support groups, community resources, and our phone # for post-discharge questions. Mother does not have a DEBP for home use.  She plans to return to work in 6 weeks and desires to give formula at that time.  Father present.   Maternal Data Formula Feeding for Exclusion: Yes Reason for exclusion: Mother's choice to formula and breast feed on admission Has patient been taught Hand Expression?: Yes Does the patient have breastfeeding experience prior to this delivery?: Yes  Feeding Feeding Type: Formula  LATCH Score                   Interventions    Lactation Tools Discussed/Used     Consult Status Consult Status: Follow-up Date: 12/07/18 Follow-up type: In-patient    Lashanda Storlie R Riley Hallum 12/06/2018, 10:42 AM

## 2018-12-07 ENCOUNTER — Encounter (HOSPITAL_COMMUNITY): Payer: Self-pay

## 2018-12-07 ENCOUNTER — Ambulatory Visit (HOSPITAL_COMMUNITY): Payer: Managed Care, Other (non HMO)

## 2018-12-07 ENCOUNTER — Ambulatory Visit (HOSPITAL_COMMUNITY)
Admission: RE | Admit: 2018-12-07 | Discharge: 2018-12-07 | Disposition: A | Discharge status: Home / Self Care | Payer: Managed Care, Other (non HMO) | Source: Ambulatory Visit | Attending: Obstetrics and Gynecology | Admitting: Obstetrics and Gynecology

## 2018-12-07 MED ORDER — SENNOSIDES-DOCUSATE SODIUM 8.6-50 MG PO TABS: 2.0000 | ORAL_TABLET | ORAL | 0 refills | Status: DC

## 2018-12-07 MED ORDER — IBUPROFEN 600 MG PO TABS: 600.0000 mg | ORAL_TABLET | Freq: Four times a day (QID) | ORAL | 1 refills | Status: DC

## 2018-12-07 NOTE — Lactation Note (Addendum)
This note was copied from a baby's chart. Lactation Consultation Note  Patient Name: Nancy Miles UKGUR'K Date: 12/07/2018   Infant was awake after attempted diaper change (diaper dry). Tongue movement noted. The Nfant purple nipple was attempted & infant suckled, but flow rate seemed too fast, so I removed nipple from infant's mouth to ensure infant did not cough or choke. I made a visit to the SLP/PT office & was informed that Dacia, SLP would be able to come by in about 30 minutes.   Mom is sleeping, but I made Dad aware that someone was coming by. Dad to call me if infant cues to feed again and no one from the feeding team has come by, yet.   I washed hand pump parts and put to the side to air dry.   Matthias Hughs [facility] 12/07/2018, 2:22 PM

## 2018-12-07 NOTE — Lactation Note (Addendum)
This note was copied from a baby's chart. Lactation Consultation Note  Patient Name: Nancy Miles EFEOF'H Date: 12/07/2018   Infant is 68 hours old & has not consumed a sufficient amount over the last 8 hours. Mom reports that infant had nursed well at 0400 (it is now past noon). Parents have tried to give a bottle multiple times with Similac slow-flow nipple, but infant has drunk only a max of 2 mLs. A labial frenum that bifurcates gum is noted on visual exam.   Infant was cueing to feed, so he was brought to the breast. He did not get a deep latch & immediately fell asleep. There was no active sucking/suckling noted.   Mom's last child was born 70 yrs ago. Mom reports having an abundant supply with her other children (per Mom, her breasts were leaking before children were born). Hand expression was taught to Mom with good result, but she found it painful. I showed her how to use a hand pump with good results. I will return to room once she has pumped some. A size 24 flange is appropriate at this time.   This is parents' first child born in the [place]. The other children were born in their native country of [place]. Mom is 25 yo.   Nancy Miles [facility] 12/07/2018, 12:03 PM

## 2018-12-07 NOTE — Lactation Note (Signed)
This note was copied from a baby's chart. Lactation Consultation Note  Patient Name: Nancy Miles FOYDX'A Date: 12/07/2018   Mom pumped 2 mL. I attempted to entice infant with EBM on a gloved finger, but infant did not seem interested. Infant has had very limited intake for the last 9 hours. Parents have been using Similac slow-flow nipple without success. I attempted the Enfamil slow-flow nipple (green), but it was immediately apparent that infant thought it was too fast. I tried the Enfamil extra slow-flow nipple, but infant acted as the flow was too fast. I left a message in the nursery for MD to place an order for feeding consult. I left a message on SLP/OT voicemail requesting a feeding consult, also.   Matthias Hughs [facility] 12/07/2018, 1:11 PM

## 2018-12-07 NOTE — Discharge Instructions (Signed)

## 2018-12-08 ENCOUNTER — Ambulatory Visit: Payer: Self-pay

## 2018-12-08 NOTE — Lactation Note (Signed)
This note was copied from a baby's chart. Lactation Consultation Note:  Infant is 5 hours old and and is at 9% weight loss.  Arrived in mothers room and she was attempting to latch infant on the right breast.  Mother reports that infant breastfed for 25 mins. On the left breast.  Assist mother with latching infant . Infant opened his mouth wide but no observed latch or suckling. Attempt to rouse infant and latch several times. Assist mother with hand expression and breast compression. Assist mother with proper latch and waiting until infant opened his mouth wide.   LC fed infant 20 ml of formula with the Dr Owens Shark bottle and clear nipple. Father advised to feed infant more and infant only took another 19ml.   Discussed pumping for 15 mins on each breast. Mother was given a #27 flange. Mother has never pumped before and doesn't feed comfortable with pumping. She was advised to use ebm or formula . Suggested to increase volume of supplement after each breastfeeding. Parents have supplemental guidelines.   Discussed treatment and prevention of engorgement. Mother is aware of available [place] services and is active with [facility].  Patient Name: Boy Rolonda Pontarelli LAGTX'M Date: 12/08/2018 Reason for consult: Follow-up assessment   Maternal Data    Feeding Feeding Type: Formula  LATCH Score                   Interventions Interventions: Assisted with latch;Skin to skin;Breast massage;Hand express;Pre-pump if needed;Breast compression;Adjust position;Support pillows;Position options;Expressed milk;Hand pump  Lactation Tools Discussed/Used     Consult Status Consult Status: Complete    Darla Lesches 12/08/2018, 10:27 AM

## 2018-12-09 ENCOUNTER — Ambulatory Visit: Payer: Self-pay

## 2018-12-09 NOTE — Lactation Note (Signed)
This note was copied from a baby's chart. Lactation Consultation Note:  Infant is 85 hours and is at 8% wt loss. Infant has been a poor feeder and is taking ebm and formula better.    LC arrived in mothers room to observed fob feeding infant ebm with a bottle. Infant is using the Ultra Premie nipple.  Infant had taken 30 ml of formula piror to getting the ebm. Infant spit moderate amt of undigested milk.  Infant had a total of 42 ml of ebm/formula when he spit up. Mother reports that she pumped 40 ml with the last pumping.   Mother has not breastfed infant since last evening. Father reports that she wants to know how much that infant is getting. Discussed that mothers breast will be full before feeding and soft after . Advised to be observant of all wet and dirty diapers.   Mother is active with [place]. A WIC referral was faxed to [facility] Infant to be scheduled for an appt with Michaelle Birks next week for a feeding assessment.   Discussed following up with an [place] OP visit. Parents agreeable to follow up with LC . Father is concerned about how much infant is getting from the breast.   Observed mothers breast to starting to fill full. Advised mother to  phone [facility] about getting a DEBP. Advised to take all parts home.  Advised to sterilize nipples every 24 hours.  I phoned PT and Becky came down to bring parents another nipple and bottle. Father reports that he was able to find the nipple on [place] and he ordered some.   Mother has harmony hand pump that she will use until she gets a WIC pump. Father ask where he could get a electric pump. I did tell him that he could rent from [employer] if desired. Or use hand pump until mother gets one from [facility]. She was advised to continue to breastfeed infant then pump after each feeding.   Discussed treatment and prevention of engorgement.   Parents receptive to all teaching.  Patient Name: Nancy Miles [id number] Date: 12/09/2018 Reason for consult:  Follow-up assessment   Maternal Data    Feeding Feeding Type: Breast Milk Nipple Type: Dr. Myra Gianotti Preemie  LATCH Score                   Interventions Interventions: Expressed milk;Hand pump  Lactation Tools Discussed/Used     Consult Status Consult Status: Follow-up Date: 12/09/18 Follow-up type: In-patient    Jess Barters [facility] 12/09/2018, 1:34 PM

## 2018-12-15 ENCOUNTER — Telehealth: Payer: Self-pay

## 2018-12-15 NOTE — Telephone Encounter (Signed)
  [place] [phone] left VM message for patient to call Office, with info on whether she is taking 11/29/10 weeks for PP recovery?    Patient returned call stating that she is unable to make a decision until after her PP Check Up.   Paperwork will be completed to reflex this.

## 2019-01-07 ENCOUNTER — Ambulatory Visit (INDEPENDENT_AMBULATORY_CARE_PROVIDER_SITE_OTHER): Payer: Managed Care, Other (non HMO) | Admitting: Obstetrics and Gynecology

## 2019-01-07 ENCOUNTER — Encounter: Payer: Self-pay | Admitting: Obstetrics

## 2019-01-07 ENCOUNTER — Encounter: Payer: Self-pay | Admitting: Obstetrics and Gynecology

## 2019-01-07 ENCOUNTER — Other Ambulatory Visit: Payer: Self-pay

## 2019-01-07 DIAGNOSIS — Z1389 Encounter for screening for other disorder: Secondary | ICD-10-CM | POA: Diagnosis not present

## 2019-01-07 MED ORDER — DOCUSATE SODIUM 100 MG PO CAPS: 100.0000 mg | ORAL_CAPSULE | Freq: Two times a day (BID) | ORAL | 2 refills | Status: DC | PRN

## 2019-01-07 NOTE — Progress Notes (Signed)
I connected with Ms. Aleathia Purdy on 01/07/19 at 11:00 AM EDT by: [facility] and verified that I am speaking with the correct person using two identifiers.  Patient is located at home and provider is located at [place].     The purpose of this virtual visit is to provide medical care while limiting exposure to the novel coronavirus. I discussed the limitations, risks, security and privacy concerns of performing an evaluation and management service by [facility] and the availability of in person appointments. I also discussed with the patient that there may be a patient responsible charge related to this service. By engaging in this virtual visit, you consent to the provision of healthcare.  Additionally, you authorize for your insurance to be billed for the services provided during this visit.  The patient expressed understanding and agreed to proceed.   Post Partum Visit Note Subjective:   Nancy Miles is a 43 y.o. [id number] female who presents for a postpartum visit. She is 4 weeks postpartum following a spontaneous vaginal delivery. I have fully reviewed the prenatal and intrapartum course. The delivery was at 39.0 gestational weeks.  Anesthesia: none. Postpartum course has been good Baby's course has been good. Baby is feeding by both breast and bottle - Jerlyn Ly Start. Bleeding staining only. Bowel function is normal. Bladder function is normal. Patient is not sexually active. Contraception method is none. Postpartum depression screening:neg     Review of Systems Pertinent items are noted in HPI.   Objective:  There were no vitals filed for this visit. Self-Obtained       Assessment:    Normal postpartum exam. Pap smear not done at today's visit. Last pap smear 04/2018 and results were normal. Next pap due 05/2019.   Plan:    1. Contraception: condoms 2. Patient is medically cleared to resume all activities of daily living.  3. Follow up in: 6 months for annual exam or as needed.   Mora Bellman, MD 01/07/2019 11:03 AM

## 2019-01-24 ENCOUNTER — Ambulatory Visit (HOSPITAL_COMMUNITY)
Admission: EM | Admit: 2019-01-24 | Discharge: 2019-01-24 | Disposition: A | Discharge status: Home / Self Care | Payer: Managed Care, Other (non HMO) | Attending: Emergency Medicine | Admitting: Emergency Medicine

## 2019-01-24 ENCOUNTER — Other Ambulatory Visit: Payer: Self-pay

## 2019-01-24 ENCOUNTER — Encounter (HOSPITAL_COMMUNITY): Payer: Self-pay | Admitting: Emergency Medicine

## 2019-01-24 DIAGNOSIS — R1013 Epigastric pain: Secondary | ICD-10-CM | POA: Diagnosis not present

## 2019-01-24 DIAGNOSIS — R112 Nausea with vomiting, unspecified: Secondary | ICD-10-CM

## 2019-01-24 MED ORDER — ALUM & MAG HYDROXIDE-SIMETH 200-200-20 MG/5ML PO SUSP
30.0000 mL | Freq: Once | ORAL | Status: AC
  Administered 2019-01-24: 30 mL via ORAL

## 2019-01-24 MED ORDER — FAMOTIDINE 20 MG PO TABS: 20.0000 mg | ORAL_TABLET | Freq: Two times a day (BID) | ORAL | 0 refills | Status: DC

## 2019-01-24 MED ORDER — LIDOCAINE VISCOUS HCL 2 % MT SOLN
OROMUCOSAL | Status: AC
  Filled 2019-01-24: qty 15

## 2019-01-24 MED ORDER — ALUM & MAG HYDROXIDE-SIMETH 200-200-20 MG/5ML PO SUSP
ORAL | Status: AC
  Filled 2019-01-24: qty 30

## 2019-01-24 NOTE — Discharge Instructions (Signed)
Please restart twice a day pepcid to try to ease your symptoms.  If any worsening of symptoms- increased pain, vomiting, dehydration, fevers- or otherwise worsening please go to the ER.  Please see provided information about diet modifications which may be helpful.

## 2019-01-24 NOTE — ED Provider Notes (Signed)
[facility]    CSN: [id number] Arrival date & time: 01/24/19  1745      History   Chief Complaint Chief Complaint  Patient presents with  . Abdominal Pain  . Emesis    HPI Nancy Miles is a 43 y.o. female.   Nancy Miles presents with family with complaints of epigastric pain with nausea and vomiting which started today around noon. She had eaten prior, vomited up what she ate. Vomited twice. Still with pain but it has improved some. 6/10 in severity. Still with some nausea. No fevers. Had two soft bm's today which is not necessarily unusual for her. No chest pain or sore throat. She has had some belching which worsens the pain. No known ill contacts. Denies any previous similar. Per chart review has had pepcid prescribed during pregnancy. She delivered her baby 12/05/2018. She is breastfeeding. Hasn't taken any medications for symptoms.   ROS per HPI, negative if not otherwise mentioned.      Past Medical History:  Diagnosis Date  . Headache   . Supervision of high risk pregnancy, antepartum 06/11/2018   .Marland Kitchen Nursing Staff Provider Office Location [facility] Dating  LMP Language   English & Trigrinya Anatomy US   Flu Vaccine   Genetic Screen  NIPS:  Low risk   TDaP vaccine    Hgb A1C or  GTT Early  Third trimester  Rhogam     LAB RESULTS  Feeding Plan Breast/Bottle Blood Type O/Positive/-- (12/20 0959)  Contraception Undecided Antibody Negative (12/20 0959) Circumcision  Rubella 3.16 (12/20 0959) Pe  . 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 VBAC, delivered 12/05/2018  . 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 Supervision of high risk pregnancy, antepartum 06/11/2018  . AMA (advanced maternal age) multigravida 35+ 06/11/2018  . H/O: C-section 06/11/2018    Past Surgical History:  Procedure Laterality Date  . CESAREAN SECTION     transverse lie    OB History    Gravida  6   Para  4   Term  4   Preterm      AB  2   Living  4     SAB  2   TAB      Ectopic      Multiple  0   Live Births  4            Home Medications    Prior to Admission medications   Medication Sig Start Date End Date Taking? Authorizing Provider  ampicillin (PRINCIPEN) 500 MG capsule Take 1 capsule (500 mg total) by mouth 3 (three) times daily. Patient not taking: Reported on 01/07/2019 12/03/18   Woodroe Mode, MD  Butalbital-APAP-Caffeine [phone] MG capsule Take 1-2 capsules by mouth every 6 (six) hours as needed for headache. Patient not taking: Reported on 01/07/2019 06/11/18   Sloan Leiter, MD  docusate sodium (COLACE) 100 MG capsule Take 1 capsule (100 mg total) by mouth 2 (two) times daily as needed. 01/07/19   Constant, Peggy, MD  famotidine (PEPCID) 20 MG tablet Take 1 tablet (20 mg total) by mouth 2 (two) times daily. 01/24/19   Zigmund Gottron, NP  ibuprofen (ADVIL) 600 MG tablet Take 1 tablet (600 mg total) by mouth every 6 (six) hours. Patient not taking: Reported on 01/07/2019 12/07/18   Nicolette Bang, DO  ondansetron (ZOFRAN-ODT) 8 MG disintegrating tablet Take  1 tablet (8 mg total) by mouth every 8 (eight) hours as needed for nausea or vomiting. Patient not taking: Reported on 01/07/2019 05/27/18   Hurshel PartyLeftwich-Kirby, Lisa A, CNM  Prenatal Vit-Fe Phos-FA-Omega (VITAFOL GUMMIES) 3.33-0.333-34.8 MG CHEW Chew 1 tablet by mouth daily. 09/17/18   Conan Bowensavis, Kelly M, MD  senna-docusate (SENOKOT-S) 8.6-50 MG tablet Take 2 tablets by mouth daily. Patient not taking: Reported on 01/07/2019 12/08/18   Arvilla MarketWallace, Catherine Lauren, DO    Family History Family History  Problem Relation Age of Onset  . Healthy Mother   . Other Neg Hx     Social History Social History   Tobacco Use  . Smoking status: Never Smoker  . Smokeless tobacco: Never Used  Substance Use Topics  . Alcohol use: No  . Drug use: No     Allergies   Patient has no known allergies.    Review of Systems Review of Systems   Physical Exam Triage Vital Signs ED Triage Vitals  Enc Vitals Group     BP 01/24/19 1757 127/85     Pulse Rate 01/24/19 1757 93     Resp 01/24/19 1757 16     Temp 01/24/19 1757 98 F (36.7 C)     Temp Source 01/24/19 1757 Temporal     SpO2 01/24/19 1757 97 %     Weight --      Height --      Head Circumference --      Peak Flow --      Pain Score 01/24/19 1819 5     Pain Loc --      Pain Edu? --      Excl. in GC? --    No data found.  Updated Vital Signs BP 127/85 (BP Location: Left Arm)   Pulse 93   Temp 98 F (36.7 C) (Temporal)   Resp 16   LMP 03/07/2018   SpO2 97%   Visual Acuity Right Eye Distance:   Left Eye Distance:   Bilateral Distance:    Right Eye Near:   Left Eye Near:    Bilateral Near:     Physical Exam Constitutional:      General: She is not in acute distress.    Appearance: She is well-developed.  Cardiovascular:     Rate and Rhythm: Normal rate.     Heart sounds: Normal heart sounds.  Pulmonary:     Effort: Pulmonary effort is normal.  Abdominal:     Tenderness: There is abdominal tenderness in the epigastric area. There is no guarding or rebound.  Skin:    General: Skin is warm and dry.  Neurological:     Mental Status: She is alert and oriented to person, place, and time.      UC Treatments / Results  Labs (all labs ordered are listed, but only abnormal results are displayed) Labs Reviewed - No data to display  EKG   Radiology No results found.  Procedures Procedures (including critical care time)  Medications Ordered in UC Medications  alum & mag hydroxide-simeth (MAALOX/MYLANTA) 200-200-20 MG/5ML suspension 30 mL (has no administration in time range)    Initial Impression / Assessment and Plan / UC Course  I have reviewed the triage vital signs and the nursing notes.  Pertinent labs & imaging results that were available during my care of the patient were reviewed by me and  considered in my medical decision making (see chart for details).     Afebrile. No tachycardia. No further  vomiting. maalox provided and refilled pepcid to restart. Diet modifications recommended. Return precautions provided. Patient verbalized understanding and agreeable to plan.   Final Clinical Impressions(s) / UC Diagnoses   Final diagnoses:  Abdominal pain, epigastric  Nausea and vomiting, intractability of vomiting not specified, unspecified vomiting type     Discharge Instructions     Please restart twice a day pepcid to try to ease your symptoms.  If any worsening of symptoms- increased pain, vomiting, dehydration, fevers- or otherwise worsening please go to the ER.  Please see provided information about diet modifications which may be helpful.    ED Prescriptions    Medication Sig Dispense Auth. Provider   famotidine (PEPCID) 20 MG tablet Take 1 tablet (20 mg total) by mouth 2 (two) times daily. 60 tablet Georgetta HaberBurky, Lorana Maffeo B, NP     Controlled Substance Prescriptions [place] Controlled Substance Registry consulted? Not Applicable   Georgetta HaberBurky, Geneva Barrero B, NP 01/24/19 1843

## 2019-01-24 NOTE — ED Triage Notes (Signed)
Pt here for abd pain and vomiting starting today

## 2019-07-28 ENCOUNTER — Other Ambulatory Visit: Payer: Self-pay

## 2019-07-28 ENCOUNTER — Ambulatory Visit
Admission: RE | Admit: 2019-07-28 | Discharge: 2019-07-28 | Disposition: A | Discharge status: Home / Self Care | Payer: Managed Care, Other (non HMO) | Source: Ambulatory Visit | Attending: Obstetrics and Gynecology | Admitting: Obstetrics and Gynecology

## 2019-07-28 DIAGNOSIS — O09521 Supervision of elderly multigravida, first trimester: Secondary | ICD-10-CM

## 2019-07-29 ENCOUNTER — Other Ambulatory Visit: Payer: Self-pay | Admitting: Obstetrics and Gynecology

## 2019-07-29 DIAGNOSIS — R928 Other abnormal and inconclusive findings on diagnostic imaging of breast: Secondary | ICD-10-CM

## 2019-08-03 ENCOUNTER — Other Ambulatory Visit: Payer: Self-pay

## 2019-08-03 ENCOUNTER — Ambulatory Visit: Payer: Medicaid Other | Admitting: Neurology

## 2019-08-03 ENCOUNTER — Encounter: Payer: Self-pay | Admitting: Neurology

## 2019-08-03 VITALS — BP 126/72 | HR 100 | Temp 97.4°F | Ht 62.0 in | Wt 174.0 lb

## 2019-08-03 DIAGNOSIS — G441 Vascular headache, not elsewhere classified: Secondary | ICD-10-CM | POA: Diagnosis not present

## 2019-08-03 DIAGNOSIS — Z789 Other specified health status: Secondary | ICD-10-CM

## 2019-08-03 DIAGNOSIS — H539 Unspecified visual disturbance: Secondary | ICD-10-CM

## 2019-08-03 DIAGNOSIS — R519 Headache, unspecified: Secondary | ICD-10-CM

## 2019-08-03 DIAGNOSIS — R51 Headache with orthostatic component, not elsewhere classified: Secondary | ICD-10-CM | POA: Diagnosis not present

## 2019-08-03 NOTE — Progress Notes (Signed)
[facility]    Provider:  Dr Lucia Gaskins Requesting Provider: Tisovec, Adelfa Koh, MD Primary Care Provider:  Gaspar Garbe, MD  CC:  Migraines for 20 years  HPI:  Nancy Miles is a 44 y.o. female here as requested by Tisovec, Adelfa Koh, MD for migraines. PMHx migraine, latent TB, currently breastfeeding, sexually active not on birth control. Migraines for 20 years. She used to take ibuprofen and it would help, now even the ibuprofen is not helping. They worsened after having the baby. Migraines can last for a week. She feels sick every day. She wakes up with headaches, sometimes if she wakes up in the middle of the night to urinate, she is snoring loudly, husband is very mad. She has headaches every day. The headaches are severe, hurts on the right side of the head or the left side, can be unilateral, pulsating/pounding behind the eyes, ca be severe. She can't read anymore, vision is terrible, if she bends over the headache is worse. She wakes up with headaches. No other focal neurologic deficits, associated symptoms, inciting events or modifiable factors.  Reviewed notes, labs and imaging from outside physicians, which showed: TSH normal  Review of Systems: Patient complains of symptoms per HPI as well as the following symptoms: headache. Pertinent negatives and positives per HPI. All others negative.   Social History   Socioeconomic History  . Marital status: Married    Spouse name: Not on file  . Number of children: 4  . Years of education: Not on file  . Highest education level: High school graduate  Occupational History  . Not on file  Tobacco Use  . Smoking status: Never Smoker  . Smokeless tobacco: Never Used  Substance and Sexual Activity  . Alcohol use: No  . Drug use: No  . Sexual activity: Yes    Birth control/protection: None  Other Topics Concern  . Not on file  Social History Narrative   Lives at home with husband and kids   Right handed  Caffeine: 1 tea in the morning, 2 cups of coffee with milk daily   Social Determinants of Health   Financial Resource Strain:   . Difficulty of Paying Living Expenses: Not on file  Food Insecurity: Unknown  . Worried About Programme researcher, broadcasting/film/video in the Last Year: Patient refused  . Ran Out of Food in the Last Year: Patient refused  Transportation Needs: Unknown  . Lack of Transportation (Medical): Patient refused  . Lack of Transportation (Non-Medical): Patient refused  Physical Activity:   . Days of Exercise per Week: Not on file  . Minutes of Exercise per Session: Not on file  Stress:   . Feeling of Stress : Not on file  Social Connections:   . Frequency of Communication with Friends and Family: Not on file  . Frequency of Social Gatherings with Friends and Family: Not on file  . Attends Religious Services: Not on file  . Active Member of Clubs or Organizations: Not on file  . Attends Banker Meetings: Not on file  . Marital Status: Not on file  Intimate Partner Violence:   . Fear of Current or Ex-Partner: Not on file  . Emotionally Abused: Not on file  . Physically Abused: Not on file  . Sexually Abused: Not on file    Family History  Problem Relation Age of Onset  . Healthy Mother   . Asthma Mother   . Other Neg Hx   . Migraines Neg  Hx     Past Medical History:  Diagnosis Date  . Headache   . Irregular menses    from PCP notes  . Low back pain    from PCP notes  . Supervision of high risk pregnancy, antepartum 06/11/2018   .Marland Kitchen Nursing Staff Provider Office Location [facility] Dating  LMP Language   English & Trigrinya Anatomy US   Flu Vaccine   Genetic Screen  NIPS:  Low risk   TDaP vaccine    Hgb A1C or  GTT Early  Third trimester  Rhogam     LAB RESULTS  Feeding Plan Breast/Bottle Blood Type O/Positive/-- (12/20 0959)  Contraception Undecided Antibody Negative (12/20 0959) Circumcision  Rubella 3.16 (12/20 0959) Pe  . 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 VBAC, delivered 12/05/2018  . 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 Supervision of high risk pregnancy, antepartum 06/11/2018  . AMA (advanced maternal age) multigravida 35+ 06/11/2018  . H/O: C-section 06/11/2018    Past Surgical History:  Procedure Laterality Date  . CESAREAN SECTION     transverse lie    Current Outpatient Medications  Medication Sig Dispense Refill  . Acetaminophen (TYLENOL PO) Take by mouth.    . famotidine (PEPCID) 20 MG tablet Take 1 tablet (20 mg total) by mouth 2 (two) times daily. (Patient taking differently: Take 20 mg by mouth as needed. ) 60 tablet 0  . levothyroxine (SYNTHROID) 25 MCG tablet Take 25 mcg by mouth daily before breakfast.    . Prenatal Vit-Fe Phos-FA-Omega (VITAFOL GUMMIES) 3.33-0.333-34.8 MG CHEW Chew 1 tablet by mouth daily. 90 tablet 5   No current facility-administered medications for this visit.    Allergies as of 08/03/2019  . (No Known Allergies)    Vitals: BP 126/72 (BP Location: Right Arm, Patient Position: Sitting)   Pulse 100   Temp (!) 97.4 F (36.3 C) Comment: taken at front  Ht 5\' 2"  (1.575 m)   Wt 174 lb (78.9 kg)   BMI 31.83 kg/m  Last Weight:  Wt Readings from Last 1 Encounters:  08/03/19 174 lb (78.9 kg)   Last Height:   Ht Readings from Last 1 Encounters:  08/03/19 5\' 2"  (1.575 m)     Physical exam: Exam: Gen: NAD, conversant, well nourised, obese, well groomed                     CV: RRR, no MRG. No Carotid Bruits. No peripheral edema, warm, nontender Eyes: Conjunctivae clear without exudates or hemorrhage  Neuro: Detailed Neurologic Exam  Speech:    Speech is normal; fluent and spontaneous with normal comprehension.  Cognition:    The patient is oriented to person, place, and time;     recent and remote memory intact;     language fluent;     normal attention, concentration,     fund of  knowledge Cranial Nerves:    The pupils are equal, round, and reactive to light. The fundi are normal and spontaneous venous pulsations are present. Visual fields are full to finger confrontation. Extraocular movements are intact. Trigeminal sensation is intact and the muscles of mastication are normal. The face is symmetric. The palate elevates in the midline. Hearing intact. Voice is normal. Shoulder shrug is normal. The tongue has normal motion without fasciculations.   Coordination:    Normal finger to nose and heel to shin.  Normal rapid alternating movements.   Gait:    Heel-toe and tandem gait are normal.   Motor Observation:    No asymmetry, no atrophy, and no involuntary movements noted. Tone:    Normal muscle tone.    Posture:    Posture is normal. normal erect    Strength:    Strength is V/V in the upper and lower limbs.      Sensation: intact to LT     Reflex Exam:  DTR's:    Deep tendon reflexes in the upper and lower extremities are normal bilaterally.   Toes:    The toes are downgoing bilaterally.   Clonus:    Clonus is absent.    Assessment/Plan: 44 year old patient with chronic migraines.  Patient has some concerning symptoms including significant vision changes she states she can even read anymore, morning headaches, positional headaches, 7 months postpartum, patient definitely needs further evaluation for this.  Mri of the brain: MRI brain due to concerning symptoms of morning headaches, positional headaches,vision changes  to look for space occupying mass, chiari or intracranial hypertension (pseudotumor). -Barriers to treating her headaches include that patient is breast-feeding and plans to continue for quite some time, patient is not on birth control and is sexually active, we spent a very long time today discussing the risks of medications and lactation in pregnancy, I gave her extensive literature, side effects, and explained nothing is entirely safe.  -   I explained she has a risk of pregnancy and this adds another layer of difficulting in treating her headaches. I think there is a language barrier Im not sure she understood, she declined pregnancy test, she stated since she was breast-feeding had and had her.  She cannot be pregnant, I explained this is not full proof.  But I am not quite sure she understood or possibly I did not understand her.  I am not comfortable giving patient medications as she did not seem to take it seriously that she could be pregnant, although less likely. may conside an interpreter? - Gave her extensive information om medications with lactation summary and pregnancy summary. Printed infor from [employer] [facility] and other resources regarding propranolol which is likely compatible with both breast-feeding and pregnancy however there are risks in pregnancy including intrauterine growth restriction and others, I gave her literature but again I am not quite sure she understood me about pregnancy and I would require a pregnancy test which she declined today. - She has not had a period since giving birth may be because she is breastfeeding but cant rule out pregnancy, she declines a pregnancy test - Having difficulty seeing, I recommended checking her eyesight with an eye doctor, she is 72 its entirely possible she needs glasses.  -After MRI results we can have patient back to discuss -She is overweight, she has morning headaches, I did recommend possible sleep evaluation for sleep apnea but she declined.  Orders Placed This Encounter  Procedures  . MR BRAIN W WO CONTRAST    Cc: Tisovec, Adelfa Koh, MD  Naomie Dean, MD  [facility] [address] [place], [place] [address]  Phone [phone] Fax [phone]  A total of 60 minutes was spent on this patient's care, reviewing imaging, past records, recent hospitalization notes and results. Over half this time was spent on counseling patient on  the  1. Breastfeeding (infant)   2. Other vascular headache   3. Morning headache   4. Positional headache   5.  Vision changes    diagnosis and different diagnostic and therapeutic options, counseling and coordination of care, risks and benefitsof management, compliance, or risk factor reduction and education.

## 2019-08-03 NOTE — Patient Instructions (Signed)
MRI of the brain Consider the medications we discussed After MRI you cam let us know if you would like to start Propranolol   Migraine Headache A migraine headache is an intense, throbbing pain on one side or both sides of the head. Migraine headaches may also cause other symptoms, such as nausea, vomiting, and sensitivity to light and noise. A migraine headache can last from 4 hours to 3 days. Talk with your doctor about what things may bring on (trigger) your migraine headaches. What are the causes? The exact cause of this condition is not known. However, a migraine may be caused when nerves in the brain become irritated and release chemicals that cause inflammation of blood vessels. This inflammation causes pain. This condition may be triggered or caused by:  Drinking alcohol.  Smoking.  Taking medicines, such as: ? Medicine used to treat chest pain (nitroglycerin). ? Birth control pills. ? Estrogen. ? Certain blood pressure medicines.  Eating or drinking products that contain nitrates, glutamate, aspartame, or tyramine. Aged cheeses, chocolate, or caffeine may also be triggers.  Doing physical activity. Other things that may trigger a migraine headache include:  Menstruation.  Pregnancy.  Hunger.  Stress.  Lack of sleep or too much sleep.  Weather changes.  Fatigue. What increases the risk? The following factors may make you more likely to experience migraine headaches:  Being a certain age. This condition is more common in people who are 29-1 years old.  Being female.  Having a family history of migraine headaches.  Being Caucasian.  Having a mental health condition, such as depression or anxiety.  Being obese. What are the signs or symptoms? The main symptom of this condition is pulsating or throbbing pain. This pain may:  Happen in any area of the head, such as on one side or both sides.  Interfere with daily activities.  Get worse with physical  activity.  Get worse with exposure to bright lights or loud noises. Other symptoms may include:  Nausea.  Vomiting.  Dizziness.  General sensitivity to bright lights, loud noises, or smells. Before you get a migraine headache, you may get warning signs (an aura). An aura may include:  Seeing flashing lights or having blind spots.  Seeing bright spots, halos, or zigzag lines.  Having tunnel vision or blurred vision.  Having numbness or a tingling feeling.  Having trouble talking.  Having muscle weakness. Some people have symptoms after a migraine headache (postdromal phase), such as:  Feeling tired.  Difficulty concentrating. How is this diagnosed? A migraine headache can be diagnosed based on:  Your symptoms.  A physical exam.  Tests, such as: ? CT scan or an MRI of the head. These imaging tests can help rule out other causes of headaches. ? Taking fluid from the spine (lumbar puncture) and analyzing it (cerebrospinal fluid analysis, or CSF analysis). How is this treated? This condition may be treated with medicines that:  Relieve pain.  Relieve nausea.  Prevent migraine headaches. Treatment for this condition may also include:  Acupuncture.  Lifestyle changes like avoiding foods that trigger migraine headaches.  Biofeedback.  Cognitive behavioral therapy. Follow these instructions at home: Medicines  Take over-the-counter and prescription medicines only as told by your health care provider.  Ask your health care provider if the medicine prescribed to you: ? Requires you to avoid driving or using heavy machinery. ? Can cause constipation. You may need to take these actions to prevent or treat constipation:  Drink enough fluid to  keep your urine pale yellow.  Take over-the-counter or prescription medicines.  Eat foods that are high in fiber, such as beans, whole grains, and fresh fruits and vegetables.  Limit foods that are high in fat and  processed sugars, such as fried or sweet foods. Lifestyle  Do not drink alcohol.  Do not use any products that contain nicotine or tobacco, such as cigarettes, e-cigarettes, and chewing tobacco. If you need help quitting, ask your health care provider.  Get at least 8 hours of sleep every night.  Find ways to manage stress, such as meditation, deep breathing, or yoga. General instructions      Keep a journal to find out what may trigger your migraine headaches. For example, write down: ? What you eat and drink. ? How much sleep you get. ? Any change to your diet or medicines.  If you have a migraine headache: ? Avoid things that make your symptoms worse, such as bright lights. ? It may help to lie down in a dark, quiet room. ? Do not drive or use heavy machinery. ? Ask your health care provider what activities are safe for you while you are experiencing symptoms.  Keep all follow-up visits as told by your health care provider. This is important. Contact a health care provider if:  You develop symptoms that are different or more severe than your usual migraine headache symptoms.  You have more than 15 headache days in one month. Get help right away if:  Your migraine headache becomes severe.  Your migraine headache lasts longer than 72 hours.  You have a fever.  You have a stiff neck.  You have vision loss.  Your muscles feel weak or like you cannot control them.  You start to lose your balance often.  You have trouble walking.  You faint.  You have a seizure. Summary  A migraine headache is an intense, throbbing pain on one side or both sides of the head. Migraines may also cause other symptoms, such as nausea, vomiting, and sensitivity to light and noise.  This condition may be treated with medicines and lifestyle changes. You may also need to avoid certain things that trigger a migraine headache.  Keep a journal to find out what may trigger your migraine  headaches.  Contact your health care provider if you have more than 15 headache days in a month or you develop symptoms that are different or more severe than your usual migraine headache symptoms. This information is not intended to replace advice given to you by your health care provider. Make sure you discuss any questions you have with your health care provider. Document Revised: 10/01/2018 Document Reviewed: 07/22/2018 Elsevier Patient Education  2020 Elsevier Inc. Propranolol Extended-Release Capsules What is this medicine? PROPRANOLOL (proe PRAN oh lole) is a beta blocker. It decreases the amount of work your heart has to do and helps your heart beat regularly. It treats high blood pressure and/or prevent chest pain (also called angina). This medicine may be used for other purposes; ask your health care provider or pharmacist if you have questions. COMMON BRAND NAME(S): Inderal LA, Inderal XL, InnoPran XL What should I tell my health care provider before I take this medicine? They need to know if you have any of these conditions:  circulation problems, or blood vessel disease  diabetes  history of heart attack or heart disease, vasospastic angina  kidney disease  liver disease  lung or breathing disease, like asthma or emphysema  pheochromocytoma  slow heart rate  thyroid disease  an unusual or allergic reaction to propranolol, other beta-blockers, medicines, foods, dyes, or preservatives  pregnant or trying to get pregnant  breast-feeding How should I use this medicine? Take this drug by mouth. Take it as directed on the prescription label at the same time every day. Do not cut, crush or chew this drug. Swallow the capsules whole. You can take it with or without food. If it upsets your stomach, take it with food. Keep taking it unless your health care provider tells you to stop. Talk to your health care provider about the use of this drug in children. Special care may  be needed. Overdosage: If you think you have taken too much of this medicine contact a poison control center or emergency room at once. NOTE: This medicine is only for you. Do not share this medicine with others. What if I miss a dose? If you miss a dose, take it as soon as you can. If it is almost time for your next dose, take only that dose. Do not take double or extra doses. What may interact with this medicine? Do not take this medicine with any of the following medications:  feverfew  phenothiazines like chlorpromazine, mesoridazine, prochlorperazine, thioridazine This medicine may also interact with the following medications:  aluminum hydroxide gel  antipyrine  antiviral medicines for HIV or AIDS  barbiturates like phenobarbital  certain medicines for blood pressure, heart disease, irregular heart beat  cimetidine  ciprofloxacin  diazepam  fluconazole  haloperidol  isoniazid  medicines for cholesterol like cholestyramine or colestipol  medicines for mental depression  medicines for migraine headache like almotriptan, eletriptan, frovatriptan, naratriptan, rizatriptan, sumatriptan, zolmitriptan  NSAIDs, medicines for pain and inflammation, like ibuprofen or naproxen  phenytoin  rifampin  teniposide  theophylline  thyroid medicines  tolbutamide  warfarin  zileuton This list may not describe all possible interactions. Give your health care provider a list of all the medicines, herbs, non-prescription drugs, or dietary supplements you use. Also tell them if you smoke, drink alcohol, or use illegal drugs. Some items may interact with your medicine. What should I watch for while using this medicine? Visit your doctor or health care professional for regular check ups. Contact your doctor right away if your symptoms worsen. Check your blood pressure and pulse rate regularly. Ask your health care professional what your blood pressure and pulse rate should be,  and when you should contact them. Do not stop taking this medicine suddenly. This could lead to serious heart-related effects. You may get drowsy or dizzy. Do not drive, use machinery, or do anything that needs mental alertness until you know how this drug affects you. Do not stand or sit up quickly, especially if you are an older patient. This reduces the risk of dizzy or fainting spells. Alcohol can make you more drowsy and dizzy. Avoid alcoholic drinks. This medicine may increase blood sugar. Ask your healthcare provider if changes in diet or medicines are needed if you have diabetes. Do not treat yourself for coughs, colds, or pain while you are taking this medicine without asking your doctor or health care professional for advice. Some ingredients may increase your blood pressure. What side effects may I notice from receiving this medicine? Side effects that you should report to your doctor or health care professional as soon as possible:  allergic reactions like skin rash, itching or hives, swelling of the face, lips, or tongue  breathing problems  cold hands  or feet  difficulty sleeping, nightmares  dry peeling skin  hallucinations  muscle cramps or weakness   signs and symptoms of high blood sugar such as being more thirsty or hungry or having to urinate more than normal. You may also feel very tired or have blurry vision.  slow heart rate  swelling of the legs and ankles  vomiting Side effects that usually do not require medical attention (report to your doctor or health care professional if they continue or are bothersome):  change in sex drive or performance  diarrhea  dry sore eyes  hair loss  nausea  weak or tired This list may not describe all possible side effects. Call your doctor for medical advice about side effects. You may report side effects to FDA at 1-800-FDA-1088. Where should I keep my medicine? Keep out of the reach of children and pets. Store  at room temperature between 15 and 30 degrees C (59 and 86 degrees F). Protect from light and moisture. Keep the container tightly closed. Avoid exposure to extreme heat. Do not freeze. Throw away any unused drug after the expiration date. NOTE: This sheet is a summary. It may not cover all possible information. If you have questions about this medicine, talk to your doctor, pharmacist, or health care provider.  2020 Elsevier/Gold Standard (2019-01-17 16:23:26)

## 2019-08-04 ENCOUNTER — Telehealth: Payer: Self-pay | Admitting: Neurology

## 2019-08-04 NOTE — Telephone Encounter (Signed)
Medicaid order sent to [facility]. They obtain the auth and reach out to the patient to schedule.  

## 2019-08-08 ENCOUNTER — Other Ambulatory Visit: Payer: Medicaid Other

## 2019-08-19 ENCOUNTER — Other Ambulatory Visit: Payer: Self-pay

## 2019-08-19 ENCOUNTER — Ambulatory Visit
Admission: RE | Admit: 2019-08-19 | Discharge: 2019-08-19 | Disposition: A | Discharge status: Home / Self Care | Payer: Medicaid Other | Source: Ambulatory Visit | Attending: Obstetrics and Gynecology | Admitting: Obstetrics and Gynecology

## 2019-08-19 ENCOUNTER — Other Ambulatory Visit: Payer: Self-pay | Admitting: Obstetrics and Gynecology

## 2019-08-19 DIAGNOSIS — R928 Other abnormal and inconclusive findings on diagnostic imaging of breast: Secondary | ICD-10-CM

## 2019-08-19 DIAGNOSIS — N6310 Unspecified lump in the right breast, unspecified quadrant: Secondary | ICD-10-CM

## 2019-08-24 ENCOUNTER — Other Ambulatory Visit: Payer: Medicaid Other

## 2019-08-26 ENCOUNTER — Ambulatory Visit
Admission: RE | Admit: 2019-08-26 | Discharge: 2019-08-26 | Disposition: A | Discharge status: Home / Self Care | Payer: Medicaid Other | Source: Ambulatory Visit | Attending: Neurology | Admitting: Neurology

## 2019-08-26 DIAGNOSIS — R519 Headache, unspecified: Secondary | ICD-10-CM

## 2019-08-26 DIAGNOSIS — H539 Unspecified visual disturbance: Secondary | ICD-10-CM

## 2019-08-26 DIAGNOSIS — G441 Vascular headache, not elsewhere classified: Secondary | ICD-10-CM

## 2019-08-26 DIAGNOSIS — R51 Headache with orthostatic component, not elsewhere classified: Secondary | ICD-10-CM

## 2019-09-06 ENCOUNTER — Other Ambulatory Visit: Payer: Self-pay

## 2019-09-06 ENCOUNTER — Encounter: Payer: Self-pay | Admitting: Advanced Practice Midwife

## 2019-09-06 ENCOUNTER — Other Ambulatory Visit (HOSPITAL_COMMUNITY)
Admission: RE | Admit: 2019-09-06 | Discharge: 2019-09-06 | Disposition: A | Discharge status: Home / Self Care | Payer: Medicaid Other | Source: Ambulatory Visit | Attending: Advanced Practice Midwife | Admitting: Advanced Practice Midwife

## 2019-09-06 ENCOUNTER — Ambulatory Visit (INDEPENDENT_AMBULATORY_CARE_PROVIDER_SITE_OTHER): Payer: Medicaid Other | Admitting: Advanced Practice Midwife

## 2019-09-06 VITALS — BP 116/79 | HR 101 | Ht 62.0 in | Wt 176.0 lb

## 2019-09-06 DIAGNOSIS — Z01419 Encounter for gynecological examination (general) (routine) without abnormal findings: Secondary | ICD-10-CM

## 2019-09-06 DIAGNOSIS — N898 Other specified noninflammatory disorders of vagina: Secondary | ICD-10-CM | POA: Insufficient documentation

## 2019-09-06 DIAGNOSIS — Z3009 Encounter for other general counseling and advice on contraception: Secondary | ICD-10-CM

## 2019-09-06 DIAGNOSIS — Z0000 Encounter for general adult medical examination without abnormal findings: Secondary | ICD-10-CM

## 2019-09-06 DIAGNOSIS — Z640 Problems related to unwanted pregnancy: Secondary | ICD-10-CM

## 2019-09-06 NOTE — Patient Instructions (Signed)

## 2019-09-06 NOTE — Progress Notes (Signed)
Pt presents for annual c/o watery vaginal discharge, denies odor/itching. Pt has questions about BTL STD testing offered; pt declined  Negative pap 06/11/18

## 2019-09-06 NOTE — Progress Notes (Signed)
Subjective:     Nancy Miles is a 44 y.o. female here at [facility] for a routine exam.  Current complaints: thin watery vaginal discharge with no odor, itching, or irritation.  Personal health questionnaire reviewed: yes.  Do you have a primary care provider? yes Do you feel safe at home? yes Has anyone hit, slapped, or kicked you recently? no  Depression screen [facility] 2/9 09/06/2019 07/14/2018  Decreased Interest 0 0  Down, Depressed, Hopeless 0 0  PHQ - 2 Score 0 0    Gynecologic History Patient's last menstrual period was 03/07/2018. Contraception: none Last Pap: 2019. Results were: normal Last mammogram: 07/28/19. Results were: abnormal with follow up US on 08/19/19 showing likely benign mass in right breast. F/U mammogram in 6 months.  Obstetric History OB History  Gravida Para Term Preterm AB Living  6 4 4   2 4   SAB TAB Ectopic Multiple Live Births  2     0 4    # Outcome Date GA Lbr Len/2nd Weight Sex Delivery Anes PTL Lv  6 Term 12/05/18 [redacted]w[redacted]d 09:00 / 00:50 7 lb 4.9 oz (3.315 kg) M Vag-Spont None  LIV  5 SAB 2015             Birth Comments: System Generated. Please review and update pregnancy details.  4 Term 07/24/06   6 lb 9.8 oz (3 kg)  VBAC   LIV  3 Term 01/31/01     VBAC   LIV  2 Term 11/09/98     CS-LTranv   LIV  1 SAB              The following portions of the patient's history were reviewed and updated as appropriate: allergies, current medications, past family history, past medical history, past social history, past surgical history and problem list.  Review of Systems Pertinent items noted in HPI and remainder of comprehensive ROS otherwise negative.    Objective:     BP 116/79   Pulse (!) 101   Ht 5\' 2"  (1.575 m)   Wt 176 lb (79.8 kg)   LMP 03/07/2018   BMI 32.19 kg/m    VS reviewed, nursing note reviewed,  Constitutional: well developed, well nourished, no distress HEENT: normocephalic CV: normal rate Pulm/chest wall: normal effort Breast Exam:   right breast normal without mass, skin or nipple changes or axillary nodes, left breast normal without mass, skin or nipple changes or axillary nodes Abdomen: soft Neuro: alert and oriented x 3 Skin: warm, dry Psych: affect normal Pelvic exam: Deferred, vaginal swab collected without speculum exam     Assessment/Plan:   1. Vaginal discharge --clear thin discharge x 2 months, no itching, burning, odor - Cervicovaginal ancillary only( [place])  2. Well woman exam with routine gynecological exam --Pt doing well, irregular menses, breastfeeding her 63 month old  3. Unwanted fertility --Considering BTL. Reviewed BTL vs LARCs. Pt had migraines with Nexplanon.  IUD may be different but may also cause migraines.  Pt given printed and verbal education about Liletta IUD and BTL. --Consent signed and witnessed for BTL --Pt to call office and make preop appt with MD if she desires BTL or call for contraceptive visit for IUD placement     Follow up in: 1 year or as needed for preop for BTL or contraceptive visit for IUD.   03/09/2018, CNM 4:09 PM

## 2019-09-07 LAB — CERVICOVAGINAL ANCILLARY ONLY
Bacterial Vaginitis (gardnerella): NEGATIVE
Candida Glabrata: NEGATIVE
Candida Vaginitis: NEGATIVE
Comment: NEGATIVE
Comment: NEGATIVE
Comment: NEGATIVE

## 2019-09-22 ENCOUNTER — Other Ambulatory Visit: Payer: Medicaid Other

## 2019-10-12 ENCOUNTER — Other Ambulatory Visit: Payer: Self-pay

## 2019-10-12 MED ORDER — VITAFOL GUMMIES 3.33-0.333-34.8 MG PO CHEW: 1.0000 | CHEWABLE_TABLET | Freq: Every day | ORAL | 0 refills | Status: DC

## 2019-10-12 NOTE — Progress Notes (Signed)
Prenatal vitamin refill 

## 2019-10-19 ENCOUNTER — Other Ambulatory Visit: Payer: Medicaid Other

## 2019-11-12 ENCOUNTER — Other Ambulatory Visit: Payer: Self-pay | Admitting: Neurology

## 2019-11-12 ENCOUNTER — Ambulatory Visit
Admission: RE | Admit: 2019-11-12 | Discharge: 2019-11-12 | Disposition: A | Discharge status: Home / Self Care | Payer: Medicaid Other | Source: Ambulatory Visit | Attending: Neurology | Admitting: Neurology

## 2019-11-12 DIAGNOSIS — R519 Headache, unspecified: Secondary | ICD-10-CM

## 2019-11-12 DIAGNOSIS — H539 Unspecified visual disturbance: Secondary | ICD-10-CM

## 2019-11-12 DIAGNOSIS — G441 Vascular headache, not elsewhere classified: Secondary | ICD-10-CM

## 2019-11-12 DIAGNOSIS — R51 Headache with orthostatic component, not elsewhere classified: Secondary | ICD-10-CM

## 2019-11-15 ENCOUNTER — Other Ambulatory Visit: Payer: Self-pay

## 2019-11-15 ENCOUNTER — Encounter (HOSPITAL_COMMUNITY): Payer: Self-pay | Admitting: Orthopedic Surgery

## 2019-11-15 ENCOUNTER — Ambulatory Visit (HOSPITAL_COMMUNITY)
Admission: EM | Admit: 2019-11-15 | Discharge: 2019-11-15 | Disposition: A | Discharge status: Home / Self Care | Payer: Medicaid Other | Attending: Emergency Medicine | Admitting: Emergency Medicine

## 2019-11-15 DIAGNOSIS — S61315A Laceration without foreign body of left ring finger with damage to nail, initial encounter: Secondary | ICD-10-CM

## 2019-11-15 MED ORDER — TETANUS-DIPHTH-ACELL PERTUSSIS 5-2.5-18.5 LF-MCG/0.5 IM SUSP: 0.5000 mL | Freq: Once | INTRAMUSCULAR | Status: DC

## 2019-11-15 MED ORDER — HYDROCODONE-ACETAMINOPHEN 5-325 MG PO TABS
1.0000 | ORAL_TABLET | Freq: Once | ORAL | Status: AC
  Administered 2019-11-15: 1 via ORAL

## 2019-11-15 MED ORDER — HYDROCODONE-ACETAMINOPHEN 5-325 MG PO TABS
ORAL_TABLET | ORAL | Status: AC
  Filled 2019-11-15: qty 1

## 2019-11-15 NOTE — ED Provider Notes (Signed)
[facility]    CSN: [id number] Arrival date & time: 11/15/19  1536      History   Chief Complaint Chief Complaint  Patient presents with  . Finger Injury    HPI Nancy Miles interpreter via [employer] interpre[employer] [name] is a 44 y.o. female presenting today for evaluation of finger laceration.  Patient was cooking and cut the tip of her left ring finger with a knife.  Wound has been bleeding since.  Last tetanus in 2020.  She denies difficulty bending finger.  Endorses a lot of pain.  Has not taken any medicines since incident.  Denies use of blood thinners.  Patient is breast-feeding.   HPI  Past Medical History:  Diagnosis Date  . Headache   . Irregular menses    from PCP notes  . Low back pain    from PCP notes  . Supervision of high risk pregnancy, antepartum 06/11/2018   .Marland Kitchen Nursing Staff Provider Office Location [facility] Dating  LMP Language   English & Trigrinya Anatomy US   Flu Vaccine   Genetic Screen  NIPS:  Low risk   TDaP vaccine    Hgb A1C or  GTT Early  Third trimester  Rhogam     LAB RESULTS  Feeding Plan Breast/Bottle Blood Type O/Positive/-- (12/20 0959)  Contraception Undecided Antibody Negative (12/20 0959) Circumcision  Rubella 3.16 (12/20 0959) Pe  . 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 VBAC, delivered 12/05/2018  . 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 Supervision of high risk pregnancy, antepartum 06/11/2018  . AMA (advanced maternal age) multigravida 35+ 06/11/2018  . H/O: C-section 06/11/2018    Past Surgical History:  Procedure Laterality Date  . CESAREAN SECTION     transverse lie    OB History    Gravida  6   Para  4   Term  4   Preterm      AB  2   Living  4     SAB  2   TAB      Ectopic      Multiple  0   Live Births  4            Home Medications    Prior to Admission medications   Medication Sig Start Date End  Date Taking? Authorizing Provider  Acetaminophen (TYLENOL PO) Take by mouth.   Yes [provider]  levothyroxine (SYNTHROID) 25 MCG tablet Take 25 mcg by mouth daily before breakfast.   Yes [provider]  Prenatal Vit-Fe Phos-FA-Omega (VITAFOL GUMMIES) 3.33-0.333-34.8 MG CHEW Chew 1 tablet by mouth daily. 10/12/19  Yes Brock Bad, MD  VITAMIN D PO Take by mouth.   Yes [provider]  famotidine (PEPCID) 20 MG tablet Take 1 tablet (20 mg total) by mouth 2 (two) times daily. Patient not taking: Reported on 09/06/2019 01/24/19   [name], NP    Family History Family History  Problem Relation Age of Onset  . Healthy Mother   . Asthma Mother   . Other Neg Hx   . Migraines Neg Hx     Social History Social History   Tobacco Use  . Smoking status: Never Smoker  . Smokeless tobacco: Never Used  Substance Use Topics  . Alcohol use: No  . Drug use: No     Allergies   Patient has no known allergies.  Review of Systems Review of Systems  Constitutional: Negative for fatigue and fever.  Eyes: Negative for visual disturbance.  Respiratory: Negative for shortness of breath.   Cardiovascular: Negative for chest pain.  Gastrointestinal: Negative for abdominal pain, nausea and vomiting.  Musculoskeletal: Negative for arthralgias and joint swelling.  Skin: Positive for wound. Negative for color change and rash.  Neurological: Negative for dizziness, weakness, light-headedness and headaches.     Physical Exam Triage Vital Signs ED Triage Vitals  Enc Vitals Group     BP 11/15/19 1653 131/73     Pulse Rate 11/15/19 1653 78     Resp 11/15/19 1653 16     Temp 11/15/19 1653 98.2 F (36.8 C)     Temp Source 11/15/19 1653 Oral     SpO2 11/15/19 1653 99 %     Weight --      Height --      Head Circumference --      Peak Flow --      Pain Score 11/15/19 1707 8     Pain Loc --      Pain Edu? --      Excl. in GC? --    No data  found.  Updated Vital Signs BP 131/73 (BP Location: Right Arm)   Pulse 78   Temp 98.2 F (36.8 C) (Oral)   Resp 16   LMP 10/15/2019   SpO2 99%   Visual Acuity Right Eye Distance:   Left Eye Distance:   Bilateral Distance:    Right Eye Near:   Left Eye Near:    Bilateral Near:     Physical Exam Vitals and nursing note reviewed.  Constitutional:      Appearance: She is well-developed.     Comments: No acute distress  HENT:     Head: Normocephalic and atraumatic.     Nose: Nose normal.  Eyes:     Conjunctiva/sclera: Conjunctivae normal.  Cardiovascular:     Rate and Rhythm: Normal rate.  Pulmonary:     Effort: Pulmonary effort is normal. No respiratory distress.  Abdominal:     General: There is no distension.  Musculoskeletal:        General: Normal range of motion.     Cervical back: Neck supple.     Comments: Full active range of motion at PIP and DIP of right middle finger, radial pulse 2+  Skin:    General: Skin is warm and dry.     Comments: Distal tip of right ring finger with avulsion laceration noted slightly involving nail, bleeding profusely  Neurological:     Mental Status: She is alert and oriented to person, place, and time.      UC Treatments / Results  Labs (all labs ordered are listed, but only abnormal results are displayed) Labs Reviewed - No data to display  EKG   Radiology No results found.  Procedures Laceration Repair  Date/Time: 11/15/2019 7:01 PM Performed by: Kendry Pfarr, Crete C, PA-C Authorized by: Zaydrian Batta, Junius Creamer, PA-C   Consent:    Consent obtained:  Verbal   Consent given by:  Patient   Risks discussed:  Infection, pain, poor cosmetic result and poor wound healing   Alternatives discussed:  No treatment Anesthesia (see MAR for exact dosages):    Anesthesia method:  None Laceration details:    Location:  Finger   Finger location:  L ring finger   Length (cm):  1 Repair type:    Repair type:  Simple  Pre-procedure  details:    Preparation:  Patient was prepped and draped in usual sterile fashion Exploration:    Hemostasis achieved with:  Direct pressure and tourniquet   Wound exploration: wound explored through full range of motion     Wound extent: no foreign bodies/material noted     Contaminated: no   Treatment:    Area cleansed with:  Soap and water and Hibiclens   Amount of cleaning:  Standard   Visualized foreign bodies/material removed: no   Skin repair:    Repair method:  Tissue adhesive Post-procedure details:    Dressing: Nonadherent gauze and Coban.   Patient tolerance of procedure:  Tolerated well, no immediate complications   (including critical care time)  Medications Ordered in UC Medications  HYDROcodone-acetaminophen (NORCO/VICODIN) 5-325 MG per tablet 1 tablet (1 tablet Oral Given 11/15/19 1752)    Initial Impression / Assessment and Plan / UC Course  I have reviewed the triage vital signs and the nursing notes.  Pertinent labs & imaging results that were available during my care of the patient were reviewed by me and considered in my medical decision making (see chart for details).     Cessation of bleeding achieved with tourniquet and direct pressure for 15 minutes.  Dermabond applied afterward.  No immediate complications.  Discussed wound care.  Tetanus up-to-date.  Provided hydrocodone for pain management today, advised to defer breast-feeding over the next 12 hours.  Follow-up for any signs of infection.  Discussed strict return precautions. Patient verbalized understanding and is agreeable with plan.  Final Clinical Impressions(s) / UC Diagnoses   Final diagnoses:  Laceration of left ring finger without foreign body with damage to nail, initial encounter     Discharge Instructions     WOUND CARE . Keep area clean and dry for 24 hours. Do not remove bandage, if applied. . After 24 hours, remove bandage and wash wound gently with mild soap and warm water.  Reapply a new bandage after cleaning wound, if directed. . Continue daily cleansing with soap and water until stitches/staples are removed. . Do not apply any ointments or creams to the wound while stitches/staples are in place, as this may cause delayed healing. . Notify the office if you experience any of the following signs of infection: Swelling, redness, pus drainage, streaking, fever >101.0 F . Notify the office if you experience excessive bleeding that does not stop after 15-20 minutes of constant, firm pressure.   ED Prescriptions    None     PDMP not reviewed this encounter.   Nancy Miles, [place] 11/15/19 1902

## 2019-11-15 NOTE — Discharge Instructions (Signed)
WOUND CARE  . Keep area clean and dry for 24 hours. Do not remove bandage, if applied. . After 24 hours, remove bandage and wash wound gently with mild soap and warm water. Reapply a new bandage after cleaning wound, if directed. . Continue daily cleansing with soap and water until stitches/staples are removed. . Do not apply any ointments or creams to the wound while stitches/staples are in place, as this may cause delayed healing. . Notify the office if you experience any of the following signs of infection: Swelling, redness, pus drainage, streaking, fever >101.0 F . Notify the office if you experience excessive bleeding that does not stop after 15-20 minutes of constant, firm pressure.   

## 2019-11-15 NOTE — ED Triage Notes (Signed)
Pt was cooking and sliced her left finger with knife. Pt wrapped finger.   No OTC meds

## 2019-11-17 ENCOUNTER — Encounter: Payer: Self-pay | Admitting: *Deleted

## 2019-11-17 ENCOUNTER — Telehealth: Payer: Self-pay | Admitting: *Deleted

## 2019-11-17 NOTE — Telephone Encounter (Signed)
I used [employer] to call the pt. Representative Myriam Jacobson [phone] LVM (ok per DPR) advising pt that her MRI brain was unremarkable, no concerns. Left office number in message for cb if any questions. Advised call will be returned by nurse on Tuesday as office is closed for memorial day holiday.

## 2019-11-17 NOTE — Telephone Encounter (Signed)
-----   Message from Anson Fret, MD sent at 11/15/2019  2:38 PM EDT ----- MRI of the brain is unremarkable, thanks

## 2019-11-19 ENCOUNTER — Encounter (HOSPITAL_COMMUNITY): Payer: Self-pay | Admitting: Emergency Medicine

## 2019-11-19 ENCOUNTER — Ambulatory Visit (HOSPITAL_COMMUNITY)
Admission: EM | Admit: 2019-11-19 | Discharge: 2019-11-19 | Disposition: A | Discharge status: Home / Self Care | Payer: Medicaid Other | Attending: Emergency Medicine | Admitting: Emergency Medicine

## 2019-11-19 ENCOUNTER — Other Ambulatory Visit: Payer: Self-pay

## 2019-11-19 DIAGNOSIS — L089 Local infection of the skin and subcutaneous tissue, unspecified: Secondary | ICD-10-CM | POA: Diagnosis not present

## 2019-11-19 DIAGNOSIS — T148XXA Other injury of unspecified body region, initial encounter: Secondary | ICD-10-CM

## 2019-11-19 MED ORDER — CEPHALEXIN 500 MG PO CAPS: 500.0000 mg | ORAL_CAPSULE | Freq: Four times a day (QID) | ORAL | 0 refills | Status: AC

## 2019-11-19 NOTE — ED Provider Notes (Signed)
[facility]    CSN: [id number] Arrival date & time: 11/19/19  1116      History   Chief Complaint Chief Complaint  Patient presents with  . Finger Injury    HPI [place]rinian interpreter via [employer] Nancy Miles is a 44 y.o. female currently breast-feeding presenting today for evaluation of wound check.  Patient had avulsion laceration on 5/25 and was seen here, bleeding was stopped and Dermabond applied.  Over the past day she has developed white appearing wound edges along with some pus.  She denies any significant pain.  Denies fevers.  Denies any increased difficulty bending finger.  HPI  Past Medical History:  Diagnosis Date  . Headache   . Irregular menses    from PCP notes  . Low back pain    from PCP notes  . Supervision of high risk pregnancy, antepartum 06/11/2018   .Marland Kitchen Nursing Staff Provider Office Location [facility] Dating  LMP Language   English & Trigrinya Anatomy US   Flu Vaccine   Genetic Screen  NIPS:  Low risk   TDaP vaccine    Hgb A1C or  GTT Early  Third trimester  Rhogam     LAB RESULTS  Feeding Plan Breast/Bottle Blood Type O/Positive/-- (12/20 0959)  Contraception Undecided Antibody Negative (12/20 0959) Circumcision  Rubella 3.16 (12/20 0959) Pe  . 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 VBAC, delivered 12/05/2018  . 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 Supervision of high risk pregnancy, antepartum 06/11/2018  . AMA (advanced maternal age) multigravida 35+ 06/11/2018  . H/O: C-section 06/11/2018    Past Surgical History:  Procedure Laterality Date  . CESAREAN SECTION     transverse lie    OB History    Gravida  6   Para  4   Term  4   Preterm      AB  2   Living  4     SAB  2   TAB      Ectopic      Multiple  0   Live Births  4            Home Medications    Prior to Admission medications   Medication Sig Start  Date End Date Taking? Authorizing Provider  Acetaminophen (TYLENOL PO) Take by mouth.   Yes [provider]  famotidine (PEPCID) 20 MG tablet Take 1 tablet (20 mg total) by mouth 2 (two) times daily. 01/24/19  Yes Burky, Dorene Grebe B, NP  levothyroxine (SYNTHROID) 25 MCG tablet Take 25 mcg by mouth daily before breakfast.   Yes [provider]  Prenatal Vit-Fe Phos-FA-Omega (VITAFOL GUMMIES) 3.33-0.333-34.8 MG CHEW Chew 1 tablet by mouth daily. 10/12/19  Yes Brock Bad, MD  VITAMIN D PO Take by mouth.   Yes [provider]  cephALEXin (KEFLEX) 500 MG capsule Take 1 capsule (500 mg total) by mouth 4 (four) times daily for 7 days. 11/19/19 11/26/19  Wieters, Junius Creamer, PA-C    Family History Family History  Problem Relation Age of Onset  . Healthy Mother   . Asthma Mother   . Other Neg Hx   . Migraines Neg Hx     Social History Social History   Tobacco Use  . Smoking status: Never Smoker  . Smokeless tobacco: Never Used  Substance Use Topics  . Alcohol use: No  .  Drug use: No     Allergies   Patient has no known allergies.   Review of Systems Review of Systems  Constitutional: Negative for fatigue and fever.  Eyes: Negative for visual disturbance.  Respiratory: Negative for shortness of breath.   Cardiovascular: Negative for chest pain.  Gastrointestinal: Negative for abdominal pain, nausea and vomiting.  Musculoskeletal: Negative for arthralgias and joint swelling.  Skin: Positive for color change and wound. Negative for rash.  Neurological: Negative for dizziness, weakness, light-headedness and headaches.     Physical Exam Triage Vital Signs ED Triage Vitals  Enc Vitals Group     BP 11/19/19 1149 121/73     Pulse Rate 11/19/19 1149 97     Resp 11/19/19 1149 16     Temp 11/19/19 1149 98 F (36.7 C)     Temp Source 11/19/19 1149 Oral     SpO2 11/19/19 1149 98 %     Weight --      Height --      Head Circumference --      Peak Flow --       Pain Score 11/19/19 1152 0     Pain Loc --      Pain Edu? --      Excl. in [place]? --    No data found.  Updated Vital Signs BP 121/73 (BP Location: Left Arm)   Pulse 97   Temp 98 F (36.7 C) (Oral)   Resp 16   SpO2 98%   Visual Acuity Right Eye Distance:   Left Eye Distance:   Bilateral Distance:    Right Eye Near:   Left Eye Near:    Bilateral Near:     Physical Exam Vitals and nursing note reviewed.  Constitutional:      Appearance: She is well-developed.     Comments: No acute distress  HENT:     Head: Normocephalic and atraumatic.     Nose: Nose normal.  Eyes:     Conjunctiva/sclera: Conjunctivae normal.  Cardiovascular:     Rate and Rhythm: Normal rate.  Pulmonary:     Effort: Pulmonary effort is normal. No respiratory distress.  Abdominal:     General: There is no distension.  Musculoskeletal:        General: Normal range of motion.     Cervical back: Neck supple.  Skin:    General: Skin is warm and dry.     Comments: Avulsion laceration with Dermabond still applied to distal tip of left ring finger, edges do appear slightly white and small area of pustular drainage noted around these areas No erythema extending more proximally Full active range of motion at DIP  Neurological:     Mental Status: She is alert and oriented to person, place, and time.      UC Treatments / Results  Labs (all labs ordered are listed, but only abnormal results are displayed) Labs Reviewed - No data to display  EKG   Radiology No results found.  Procedures Procedures (including critical care time)  Medications Ordered in UC Medications - No data to display  Initial Impression / Assessment and Plan / UC Course  I have reviewed the triage vital signs and the nursing notes.  Pertinent labs & imaging results that were available during my care of the patient were reviewed by me and considered in my medical decision making (see chart for details).     Wound  appearance is concerning for infection.  Initiating on Keflex x1  week.  Continue Tylenol and ibuprofen for pain.  Continue to monitor for gradual healing of wound and resolution of infection.  Discussed strict return precautions. Patient verbalized understanding and is agreeable with plan.  Final Clinical Impressions(s) / UC Diagnoses   Final diagnoses:  Wound infection  Finger infection     Discharge Instructions     Begin Keflex 4 times daily for the next week Tylenol and ibuprofen for pain If you have any worsening redness, pus, swelling, pain spreading beyond wound please return for checkup   ED Prescriptions    Medication Sig Dispense Auth. Provider   cephALEXin (KEFLEX) 500 MG capsule Take 1 capsule (500 mg total) by mouth 4 (four) times daily for 7 days. 28 capsule Wieters, Whitlash C, PA-C     PDMP not reviewed this encounter.   Lew Dawes, [place] 11/19/19 1242

## 2019-11-19 NOTE — Discharge Instructions (Signed)
Begin Keflex 4 times daily for the next week Tylenol and ibuprofen for pain If you have any worsening redness, pus, swelling, pain spreading beyond wound please return for checkup

## 2019-11-19 NOTE — ED Triage Notes (Signed)
Pt c/o left finger injury onset 3 days ago. She states it is now turning white where the incision is and she is concerned about infection.

## 2019-11-25 IMAGING — US US MFM OB TRANSVAGINAL
1 series · 12 of 28 positions shown · non-contrast
Comparison: none

[Series 1: us mfm ob transvaginal · 86 acquisitions, 12 frames shown]
[im 4/86]
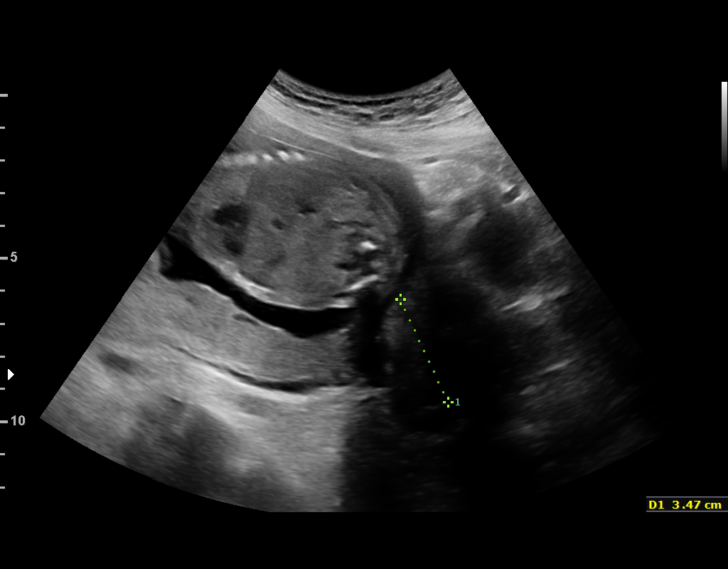
[im 10/86]
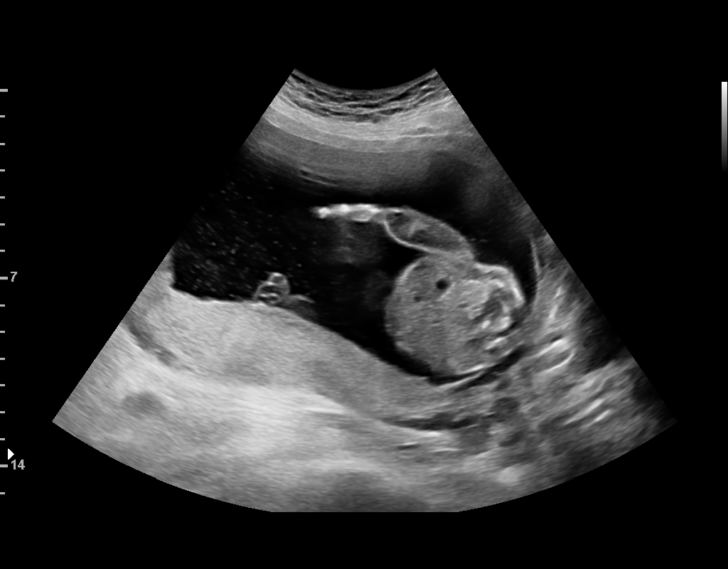
[im 16/86]
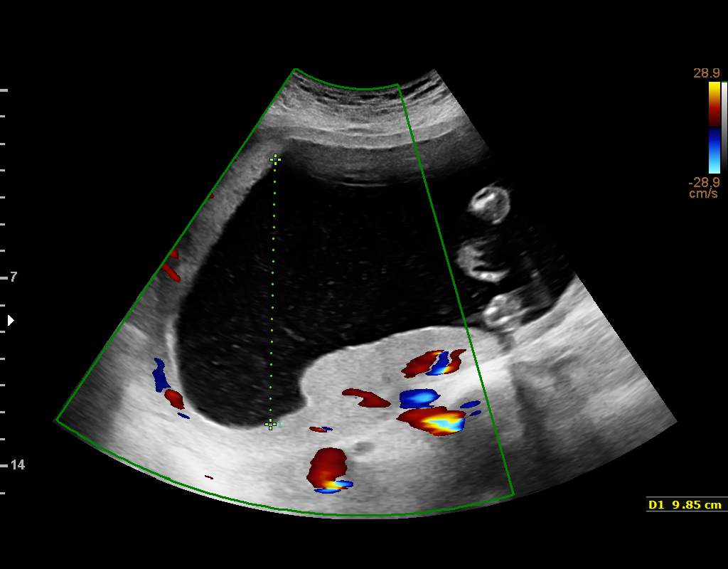
[im 26/86]
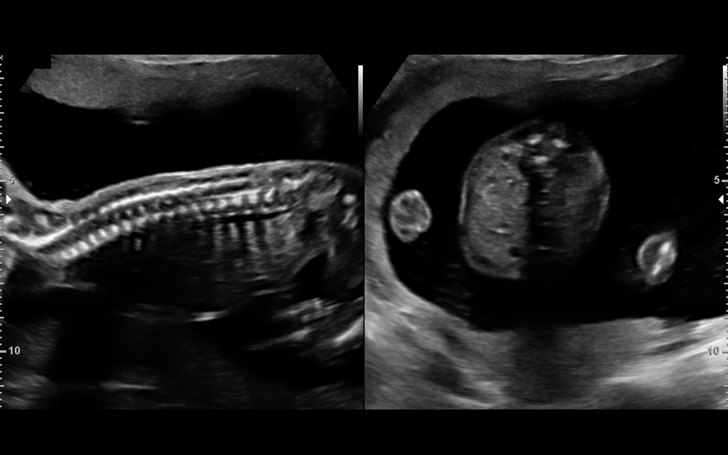
[im 32/86]
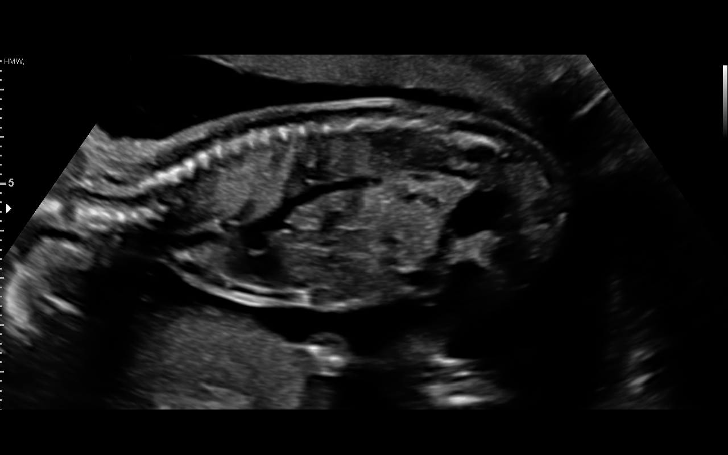
[im 38/86]
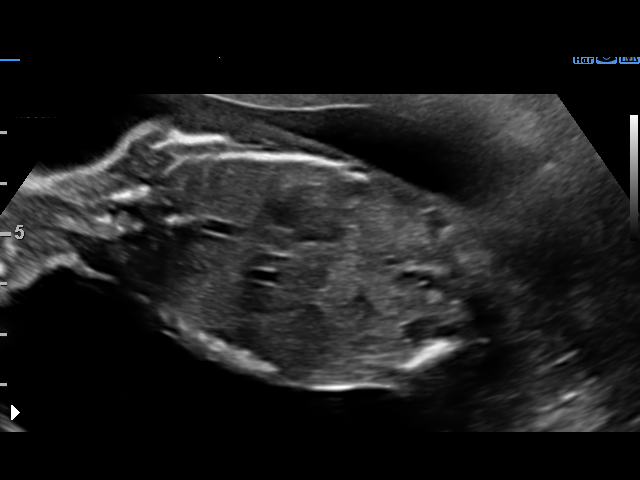
[im 48/86]
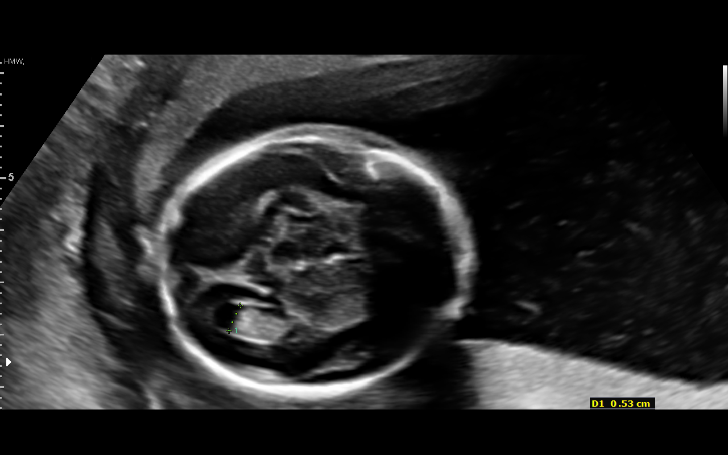
[im 54/86]
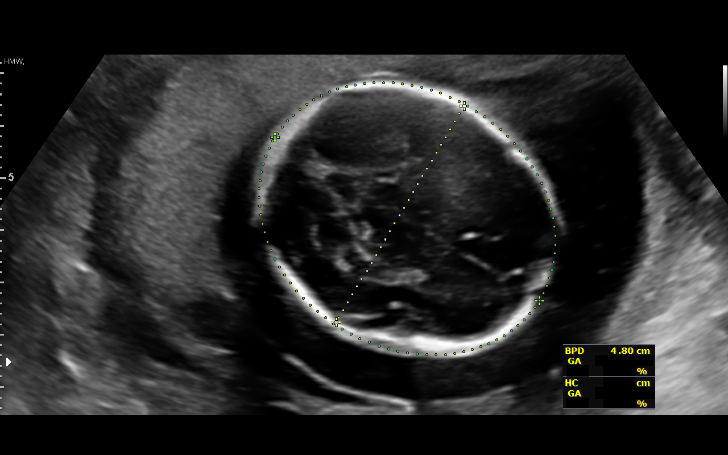
[im 60/86]
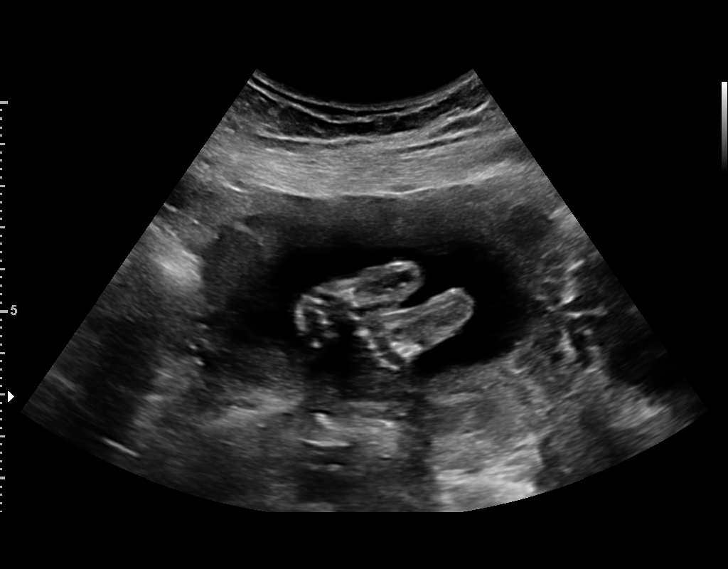
[im 70/86]
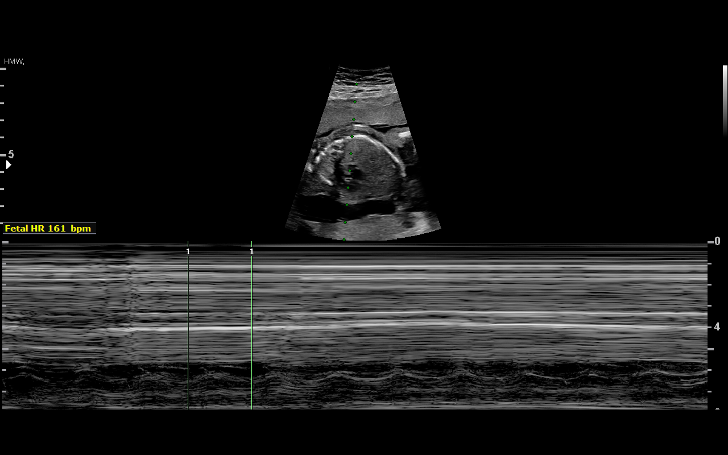
[im 76/86]
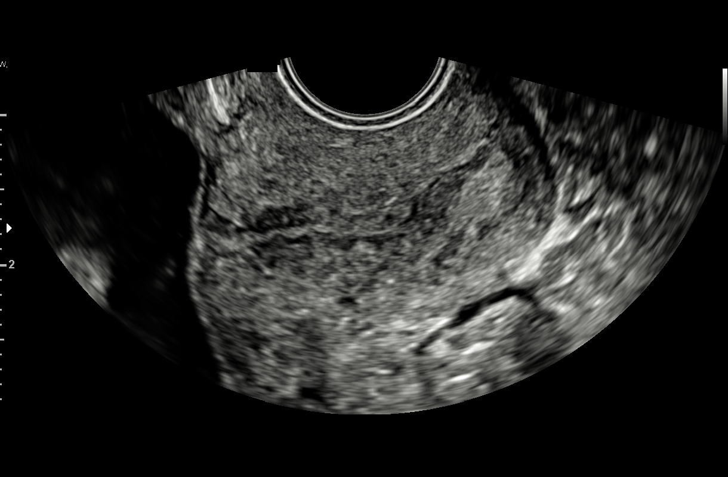
[im 82/86]
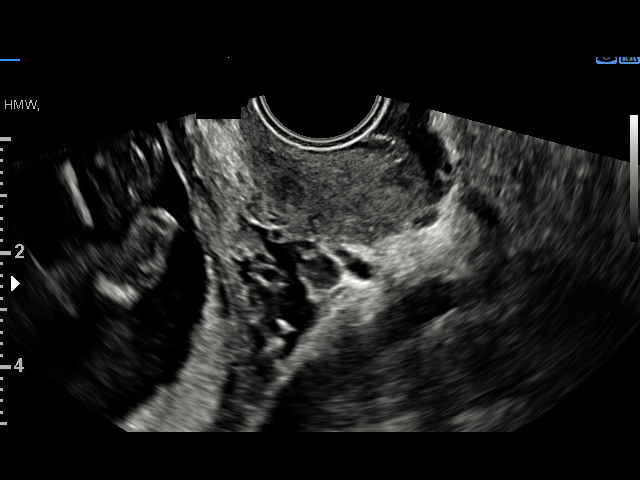

[12 of 28 positions shown; findings below may reference images not displayed]

OB/Gyn Clinic

 ----------------------------------------------------------------------

 ----------------------------------------------------------------------
Indications

  Encounter for antenatal screening for
  malformations (low risk NIPS)
  Advanced maternal age multigravida 35+,
  second trimester
  Medical complication of pregnancy (+
  Quantiferon -TB, neg CXR, has been on 4
  months of Rifampin)
  19 weeks gestation of pregnancy
 ----------------------------------------------------------------------
Fetal Evaluation

 Num Of Fetuses:         1
 Fetal Heart Rate(bpm):  161
 Cardiac Activity:       Observed
 Presentation:           Breech
 Placenta:               Posterior Fundal
 P. Cord Insertion:      Visualized, central

 Amniotic Fluid
 AFI FV:      Within normal limits

                             Largest Pocket(cm)

Biometry
 BPD:      48.3  mm     G. Age:  20w 4d         95  %    CI:        79.43   %    70 - 86
                                                         FL/HC:      19.6   %    16.1 -
 HC:      171.3  mm     G. Age:  19w 5d         70  %    HC/AC:      1.21        1.09 -
 AC:      141.5  mm     G. Age:  19w 4d         58  %    FL/BPD:     69.6   %
 FL:       33.6  mm     G. Age:  20w 4d         87  %    FL/AC:      23.7   %    20 - 24
 HUM:        31  mm     G. Age:  20w 2d         81  %

 Est. FW:     325  gm    0 lb 11 oz      58  %
OB History

 Gravidity:    6         Term:   3        Prem:   0        SAB:   2
 TOP:          0       Ectopic:  0        Living: 3
Gestational Age

 LMP:           19w 1d        Date:  03/07/18                 EDD:   12/12/18
 U/S Today:     20w 1d                                        EDD:   12/05/18
 Best:          19w 1d     Det. By:  LMP  (03/07/18)          EDD:   12/12/18
Anatomy

 Cranium:               Appears normal         Aortic Arch:            Appears normal
 Cavum:                 Appears normal         Ductal Arch:            Appears normal
 Ventricles:            Appears normal         Diaphragm:              Appears normal
 Choroid Plexus:        Appears normal         Stomach:                Appears normal, left
                                                                       sided
 Cerebellum:            Appears normal         Abdomen:                Appears normal
 Posterior Fossa:       Appears normal         Abdominal Wall:         Appears nml (cord
                                                                       insert, abd wall)
 Nuchal Fold:           Not applicable (>20    Cord Vessels:           Appears normal (3
                        wks GA)                                        vessel cord)
 Face:                  Appears normal         Kidneys:                Appear normal
                        (orbits and profile)
 Lips:                  Not well visualized    Bladder:                Appears normal
 Thoracic:              Appears normal         Spine:                  Appears normal
 Heart:                 Not well visualized    Upper Extremities:      Appears normal
 RVOT:                  Appears normal         Lower Extremities:      Appears normal
 LVOT:                  Appears normal

 Other:  Fetus appears to be a male. Heels and 5th digit visualized. Nasal
         bone visualized.
Cervix Uterus Adnexa

 Cervix
 Length:            3.7  cm.
 Measured transvaginally.

 Uterus
 No abnormality visualized.

 Left Ovary
 No adnexal mass visualized.

 Right Ovary
 No adnexal mass visualized.
 Cul De Sac
 No free fluid seen.

 Adnexa
 No abnormality visualized.
Impression

 Ms. Merlin, [place] P3 at 19-weeks' gestation, is here for fetal
 anatomy scan. I counseled the patient with help of an
 interpreter.

 Obstetric history is significant for a term cesarean delivery
 followed by 2 term VBACs.

 She was treated for positive Quantiferon Gold with rifampin.

 On cell-free fetal DNA screening, the risks of fetal
 aneuploidies are not increased.

 We performed a fetal anatomy scan. No markers of
 aneuploidies or fetal structural defects are seen. Fetal
 biometry is consistent with her previously-established dates.
 Amniotic fluid is normal and good fetal activity is seen.
 We performed transvaginal ultrasound to better-assess
 placental location in relation to the internal os. Placenta is
 posterior and there is no evidence of previa or accreta. The
 cervix measures 3.7 cm, which is normal.

 I discussed the significance and limitations of cell-free fetal
 DNA screening and ultrasound. I also informed her that only
 amniocentesis will give a definitive result on the fetal
 karyotype. I explained the procedure and possible
 complication of miscarriage (1 in 400 to 500 procedures).

 Patient would like to discuss with her family and decide on
 amniocentesis.
Recommendations

 -An appointment was made for her to return in 4 weeks for
 completion of fetal anatomy.
                 Susanna, Chanell

## 2020-01-19 ENCOUNTER — Other Ambulatory Visit: Payer: Self-pay

## 2020-01-19 ENCOUNTER — Encounter (HOSPITAL_COMMUNITY): Payer: Self-pay

## 2020-01-19 ENCOUNTER — Ambulatory Visit (HOSPITAL_COMMUNITY)
Admission: EM | Admit: 2020-01-19 | Discharge: 2020-01-19 | Disposition: A | Discharge status: Home / Self Care | Payer: Medicaid Other | Attending: Family Medicine | Admitting: Family Medicine

## 2020-01-19 DIAGNOSIS — M549 Dorsalgia, unspecified: Secondary | ICD-10-CM | POA: Diagnosis not present

## 2020-01-19 DIAGNOSIS — R5383 Other fatigue: Secondary | ICD-10-CM | POA: Diagnosis not present

## 2020-01-19 DIAGNOSIS — R1013 Epigastric pain: Secondary | ICD-10-CM

## 2020-01-19 DIAGNOSIS — R110 Nausea: Secondary | ICD-10-CM

## 2020-01-19 DIAGNOSIS — R519 Headache, unspecified: Secondary | ICD-10-CM

## 2020-01-19 DIAGNOSIS — Z3202 Encounter for pregnancy test, result negative: Secondary | ICD-10-CM

## 2020-01-19 LAB — POCT URINALYSIS DIP (DEVICE)
Glucose, UA: NEGATIVE mg/dL
Hgb urine dipstick: NEGATIVE
Ketones, ur: NEGATIVE mg/dL
Leukocytes,Ua: NEGATIVE
Nitrite: NEGATIVE
Protein, ur: 30 mg/dL — AB
Specific Gravity, Urine: 1.025 (ref 1.005–1.030)
Urobilinogen, UA: 0.2 mg/dL (ref 0.0–1.0)
pH: 5.5 (ref 5.0–8.0)

## 2020-01-19 LAB — POC URINE PREG, ED: Preg Test, Ur: NEGATIVE

## 2020-01-19 MED ORDER — ONDANSETRON HCL 4 MG PO TABS: 4.0000 mg | ORAL_TABLET | Freq: Four times a day (QID) | ORAL | 0 refills | Status: DC

## 2020-01-19 MED ORDER — IBUPROFEN 600 MG PO TABS
600.0000 mg | ORAL_TABLET | Freq: Four times a day (QID) | ORAL | 0 refills | Status: DC | PRN
Start: 2020-01-19 — End: 2022-11-13

## 2020-01-19 MED ORDER — OMEPRAZOLE 20 MG PO CPDR
20.0000 mg | DELAYED_RELEASE_CAPSULE | Freq: Every day | ORAL | 0 refills | Status: DC
Start: 2020-01-19 — End: 2021-04-24

## 2020-01-19 NOTE — Discharge Instructions (Signed)
Drink plenty of fluids Take omeprazole once a day This will help with stomach pain I have prescribed nausea medicine to take as needed I have prescribed ibuprofen for your headache and pain Get plenty of rest See your primary care doctor if not improving by Monday

## 2020-01-19 NOTE — ED Provider Notes (Signed)
[facility]    CSN: [id number] Arrival date & time: 01/19/20  1156      History   Chief Complaint Chief Complaint  Patient presents with   Headache   Back Pain   Fatigue   Gastroesophageal Reflux    HPI Nancy Miles is a 44 y.o. female.   HPI   Patient is seen with chief complaint of epigastric pain.  She states she is had some nausea.  She states the pain sometimes goes through to her back.  She states that made it difficult for her to sleep last night.  She woke up this morning with a headache.  Feels very fatigued. No sweats chills or fever.  None vomiting or diarrhea.  Normal bowel movements. Irregular menstrual periods, LMP 12/24/2019 Patient is breast-feeding She takes famotidine daily for history of GERD.  She states she has had stomach problems for much of her adult life  Past Medical History:  Diagnosis Date   Headache    Irregular menses    from PCP notes   Low back pain    from PCP notes   Supervision of high risk pregnancy, antepartum 06/11/2018   .Marland Kitchen Nursing Staff Provider Office Location [facility] Dating  LMP Language   English & Trigrinya Anatomy US   Flu Vaccine   Genetic Screen  NIPS:  Low risk   TDaP vaccine    Hgb A1C or  GTT Early  Third trimester  Rhogam     LAB RESULTS  Feeding Plan Breast/Bottle Blood Type O/Positive/-- (12/20 0959)  Contraception Undecided Antibody Negative (12/20 0959) Circumcision  Rubella 3.16 (12/20 0959) Pe   TB lung, 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    Patient Active Problem List   Diagnosis Date Noted   VBAC, delivered 12/05/2018   TB lung, 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 Authorizing Provider  Acetaminophen (TYLENOL PO) Take by mouth.    [provider]  famotidine (PEPCID) 20 MG tablet Take 1 tablet (20 mg total) by mouth 2 (two) times daily. 01/24/19   [name], NP  ibuprofen (ADVIL) 600 MG tablet Take 1 tablet (600 mg total) by mouth every 6 (six) hours as needed. 01/19/20   Eustace Moore, MD  levothyroxine (SYNTHROID) 25 MCG tablet Take 25 mcg by mouth daily before breakfast.    [provider]  omeprazole (PRILOSEC) 20 MG capsule Take 1 capsule (20 mg total) by mouth daily. 01/19/20   Eustace Moore, MD  ondansetron (ZOFRAN) 4 MG tablet Take 1-2 tablets (4-8 mg total) by mouth every 6 (six) hours. As needed nausea 01/19/20   Eustace Moore, MD  Prenatal Vit-Fe Phos-FA-Omega (VITAFOL GUMMIES) 3.33-0.333-34.8  MG CHEW Chew 1 tablet by mouth daily. 10/12/19   Brock Bad, MD  VITAMIN D PO Take by mouth.    [provider]    Family History Family History  Problem Relation Age of Onset   Healthy Mother    Asthma Mother    Other Neg Hx    Migraines Neg Hx     Social History Social History   Tobacco Use   Smoking status: Never Smoker   Smokeless tobacco: Never Used  Building services engineer Use: Never used  Substance Use Topics   Alcohol use: No   Drug use: No     Allergies   Patient has no known allergies.   Review of Systems Review of Systems See HPI  Physical Exam Triage Vital Signs ED Triage Vitals  Enc Vitals Group     BP 01/19/20 1320 106/73     Pulse Rate 01/19/20 1320 94     Resp 01/19/20 1320 18     Temp 01/19/20 1320 99 F (37.2 C)     Temp Source 01/19/20 1320 Oral     SpO2 01/19/20 1320 99 %     Weight --      Height --      Head Circumference --       Peak Flow --      Pain Score 01/19/20 1319 5     Pain Loc --      Pain Edu? --      Excl. in [place]? --    No data found.  Updated Vital Signs BP 106/73 (BP Location: Left Arm)    Pulse 94    Temp 99 F (37.2 C) (Oral)    Resp 18    SpO2 99%      Physical Exam Constitutional:      General: She is not in acute distress.    Appearance: She is well-developed.  HENT:     Head: Normocephalic and atraumatic.     Right Ear: Tympanic membrane and ear canal normal.     Left Ear: Tympanic membrane and ear canal normal.     Nose: Nose normal.     Mouth/Throat:     Mouth: Mucous membranes are moist.  Eyes:     Conjunctiva/sclera: Conjunctivae normal.     Pupils: Pupils are equal, round, and reactive to light.  Cardiovascular:     Rate and Rhythm: Normal rate and regular rhythm.     Heart sounds: Normal heart sounds.  Pulmonary:     Effort: Pulmonary effort is normal. No respiratory distress.  Abdominal:     General: There is no distension.     Palpations: Abdomen is soft.     Comments: Abdomen is soft.  Minimal tenderness in the midepigastrium.  No organomegaly.  Musculoskeletal:        General: Normal range of motion.     Cervical back: Normal range of motion.  Skin:    General: Skin is warm and dry.  Neurological:     Mental Status: She is alert.  Psychiatric:        Mood and Affect: Mood normal.        Behavior: Behavior normal.      UC Treatments / Results  Labs (all labs ordered are listed, but only abnormal results are displayed) Labs Reviewed  POCT URINALYSIS DIP (DEVICE) - Abnormal; Notable for the following components:      Result Value   Bilirubin Urine SMALL (*)  Protein, ur 30 (*)    All other components within normal limits  POC URINE PREG, ED    EKG   Radiology No results found.  Procedures Procedures (including critical care time)  Medications Ordered in UC Medications - No data to display  Initial Impression / Assessment and Plan / UC Course   I have reviewed the triage vital signs and the nursing notes.  Pertinent labs & imaging results that were available during my care of the patient were reviewed by me and considered in my medical decision making (see chart for details).     Diagnostic tests none Final Clinical Impressions(s) / UC Diagnoses   Final diagnoses:  Abdominal pain, epigastric  Nausea without vomiting     Discharge Instructions     Drink plenty of fluids Take omeprazole once a day This will help with stomach pain I have prescribed nausea medicine to take as needed I have prescribed ibuprofen for your headache and pain Get plenty of rest See your primary care doctor if not improving by Monday   ED Prescriptions    Medication Sig Dispense Auth. Provider   omeprazole (PRILOSEC) 20 MG capsule Take 1 capsule (20 mg total) by mouth daily. 30 capsule Eustace Moore, MD   ondansetron (ZOFRAN) 4 MG tablet Take 1-2 tablets (4-8 mg total) by mouth every 6 (six) hours. As needed nausea 12 tablet Eustace Moore, MD   ibuprofen (ADVIL) 600 MG tablet Take 1 tablet (600 mg total) by mouth every 6 (six) hours as needed. 30 tablet Eustace Moore, MD     PDMP not reviewed this encounter.   Eustace Moore, MD 01/19/20 [phone]

## 2020-01-19 NOTE — ED Triage Notes (Signed)
Pt presents with back pain, headache, fatigue, and epigastric burning with gas for past few days.

## 2020-01-24 ENCOUNTER — Other Ambulatory Visit: Payer: Self-pay | Admitting: Obstetrics and Gynecology

## 2020-01-24 ENCOUNTER — Ambulatory Visit
Admission: RE | Admit: 2020-01-24 | Discharge: 2020-01-24 | Disposition: A | Discharge status: Home / Self Care | Payer: Medicaid Other | Source: Ambulatory Visit | Attending: Obstetrics and Gynecology | Admitting: Obstetrics and Gynecology

## 2020-01-24 ENCOUNTER — Other Ambulatory Visit: Payer: Self-pay

## 2020-01-24 DIAGNOSIS — N6310 Unspecified lump in the right breast, unspecified quadrant: Secondary | ICD-10-CM

## 2020-01-26 ENCOUNTER — Other Ambulatory Visit: Payer: Self-pay | Admitting: Obstetrics & Gynecology

## 2020-01-26 ENCOUNTER — Other Ambulatory Visit: Payer: Self-pay | Admitting: Internal Medicine

## 2020-01-27 ENCOUNTER — Other Ambulatory Visit: Payer: Self-pay

## 2020-01-27 ENCOUNTER — Ambulatory Visit
Admission: RE | Admit: 2020-01-27 | Discharge: 2020-01-27 | Disposition: A | Discharge status: Home / Self Care | Payer: Medicaid Other | Source: Ambulatory Visit | Attending: Obstetrics and Gynecology | Admitting: Obstetrics and Gynecology

## 2020-01-27 ENCOUNTER — Other Ambulatory Visit: Payer: Self-pay | Admitting: Obstetrics and Gynecology

## 2020-01-27 DIAGNOSIS — N6310 Unspecified lump in the right breast, unspecified quadrant: Secondary | ICD-10-CM

## 2020-01-31 ENCOUNTER — Other Ambulatory Visit: Payer: Self-pay

## 2020-01-31 ENCOUNTER — Ambulatory Visit: Admission: RE | Admit: 2020-01-31 | Payer: Medicaid Other | Source: Ambulatory Visit

## 2020-02-21 ENCOUNTER — Other Ambulatory Visit: Payer: Medicaid Other

## 2020-02-23 ENCOUNTER — Ambulatory Visit: Payer: Medicaid Other | Admitting: Obstetrics and Gynecology

## 2020-03-08 ENCOUNTER — Ambulatory Visit: Payer: Medicaid Other | Admitting: Family Medicine

## 2020-03-08 ENCOUNTER — Other Ambulatory Visit: Payer: Self-pay

## 2020-03-08 ENCOUNTER — Encounter: Payer: Self-pay | Admitting: Family Medicine

## 2020-03-08 VITALS — BP 110/73 | HR 89 | Wt 172.0 lb

## 2020-03-08 DIAGNOSIS — R232 Flushing: Secondary | ICD-10-CM

## 2020-03-08 DIAGNOSIS — R319 Hematuria, unspecified: Secondary | ICD-10-CM | POA: Diagnosis not present

## 2020-03-08 DIAGNOSIS — Z30430 Encounter for insertion of intrauterine contraceptive device: Secondary | ICD-10-CM | POA: Diagnosis not present

## 2020-03-08 DIAGNOSIS — Z227 Latent tuberculosis: Secondary | ICD-10-CM

## 2020-03-08 LAB — POCT URINE PREGNANCY: Preg Test, Ur: NEGATIVE

## 2020-03-08 MED ORDER — LEVONORGESTREL 19.5 MCG/DAY IU IUD: INTRAUTERINE_SYSTEM | Freq: Once | INTRAUTERINE | Status: AC

## 2020-03-08 NOTE — Progress Notes (Signed)
GYN c/o hot and cold flashes, LUQ back pain 8/10, headaches and neckache 9/10 x 1 month.   Last period 11/24/2019. UPT today is NEGATIVE.  Last PAP 06/11/2018 - NIL

## 2020-03-08 NOTE — Patient Instructions (Signed)
Menopause Menopause is the normal time of life when menstrual periods stop completely. It is usually confirmed by 12 months without a menstrual period. The transition to menopause (perimenopause) most often happens between the ages of 45 and 55. During perimenopause, hormone levels change in your body, which can cause symptoms and affect your health. Menopause may increase your risk for:  Loss of bone (osteoporosis), which causes bone breaks (fractures).  Depression.  Hardening and narrowing of the arteries (atherosclerosis), which can cause heart attacks and strokes. What are the causes? This condition is usually caused by a natural change in hormone levels that happens as you get older. The condition may also be caused by surgery to remove both ovaries (bilateral oophorectomy). What increases the risk? This condition is more likely to start at an earlier age if you have certain medical conditions or treatments, including:  A tumor of the pituitary gland in the brain.  A disease that affects the ovaries and hormone production.  Radiation treatment for cancer.  Certain cancer treatments, such as chemotherapy or hormone (anti-estrogen) therapy.  Heavy smoking and excessive alcohol use.  Family history of early menopause. This condition is also more likely to develop earlier in women who are very thin. What are the signs or symptoms? Symptoms of this condition include:  Hot flashes.  Irregular menstrual periods.  Night sweats.  Changes in feelings about sex. This could be a decrease in sex drive or an increased comfort around your sexuality.  Vaginal dryness and thinning of the vaginal walls. This may cause painful intercourse.  Dryness of the skin and development of wrinkles.  Headaches.  Problems sleeping (insomnia).  Mood swings or irritability.  Memory problems.  Weight gain.  Hair growth on the face and chest.  Bladder infections or problems with urinating. How  is this diagnosed? This condition is diagnosed based on your medical history, a physical exam, your age, your menstrual history, and your symptoms. Hormone tests may also be done. How is this treated? In some cases, no treatment is needed. You and your health care provider should make a decision together about whether treatment is necessary. Treatment will be based on your individual condition and preferences. Treatment for this condition focuses on managing symptoms. Treatment may include:  Menopausal hormone therapy (MHT).  Medicines to treat specific symptoms or complications.  Acupuncture.  Vitamin or herbal supplements. Before starting treatment, make sure to let your health care provider know if you have a personal or family history of:  Heart disease.  Breast cancer.  Blood clots.  Diabetes.  Osteoporosis. Follow these instructions at home: Lifestyle  Do not use any products that contain nicotine or tobacco, such as cigarettes and e-cigarettes. If you need help quitting, ask your health care provider.  Get at least 30 minutes of physical activity on 5 or more days each week.  Avoid alcoholic and caffeinated beverages, as well as spicy foods. This may help prevent hot flashes.  Get 7-8 hours of sleep each night.  If you have hot flashes, try: ? Dressing in layers. ? Avoiding things that may trigger hot flashes, such as spicy food, warm places, or stress. ? Taking slow, deep breaths when a hot flash starts. ? Keeping a fan in your home and office.  Find ways to manage stress, such as deep breathing, meditation, or journaling.  Consider going to group therapy with other women who are having menopause symptoms. Ask your health care provider about recommended group therapy meetings. Eating and   drinking  Eat a healthy, balanced diet that contains whole grains, lean protein, low-fat dairy, and plenty of fruits and vegetables.  Your health care provider may recommend  adding more soy to your diet. Foods that contain soy include tofu, tempeh, and soy milk.  Eat plenty of foods that contain calcium and vitamin D for bone health. Items that are rich in calcium include low-fat milk, yogurt, beans, almonds, sardines, broccoli, and kale. Medicines  Take over-the-counter and prescription medicines only as told by your health care provider.  Talk with your health care provider before starting any herbal supplements. If prescribed, take vitamins and supplements as told by your health care provider. These may include: ? Calcium. Women age 51 and older should get 1,200 mg (milligrams) of calcium every day. ? Vitamin D. Women need 600-800 International Units of vitamin D each day. ? Vitamins B12 and B6. Aim for 50 micrograms of B12 and 1.5 mg of B6 each day. General instructions  Keep track of your menstrual periods, including: ? When they occur. ? How heavy they are and how long they last. ? How much time passes between periods.  Keep track of your symptoms, noting when they start, how often you have them, and how long they last.  Use vaginal lubricants or moisturizers to help with vaginal dryness and improve comfort during sex.  Keep all follow-up visits as told by your health care provider. This is important. This includes any group therapy or counseling. Contact a health care provider if:  You are still having menstrual periods after age 55.  You have pain during sex.  You have not had a period for 12 months and you develop vaginal bleeding. Get help right away if:  You have: ? Severe depression. ? Excessive vaginal bleeding. ? Pain when you urinate. ? A fast or irregular heart beat (palpitations). ? Severe headaches. ? Abdomen (abdominal) pain or severe indigestion.  You fell and you think you have a broken bone.  You develop leg or chest pain.  You develop vision problems.  You feel a lump in your breast. Summary  Menopause is the normal  time of life when menstrual periods stop completely. It is usually confirmed by 12 months without a menstrual period.  The transition to menopause (perimenopause) most often happens between the ages of 45 and 55.  Symptoms can be managed through medicines, lifestyle changes, and complementary therapies such as acupuncture.  Eat a balanced diet that is rich in nutrients to promote bone health and heart health and to manage symptoms during menopause. This information is not intended to replace advice given to you by your health care provider. Make sure you discuss any questions you have with your health care provider. Document Revised: 05/22/2017 Document Reviewed: 07/12/2016 Elsevier Patient Education  2020 [employer]  

## 2020-03-08 NOTE — Assessment & Plan Note (Signed)
No further treatment is needed per ID consult

## 2020-03-08 NOTE — Progress Notes (Signed)
Subjective:    Patient ID: Nancy Miles is a 44 y.o. female presenting with Gynecologic Exam (Hot Flashes)  on 03/08/2020  HPI: Hot flashes x 1 month. Also with neck pain. Last period 6/3.  Has only had 2 cycles since giving birth to her son and June 2020.  She continues to breast-feed. She has a history of latent TB although she reports no more treatment for TB is needed.  She denies weight loss, cough, fever.   She is currently on no contraception but does desire this is she does not want to get pregnant.  She has not had sexual intercourse in the last 2 months.   Review of Systems  Constitutional: Negative for chills and fever.  Respiratory: Negative for cough and shortness of breath.   Cardiovascular: Negative for chest pain.  Gastrointestinal: Negative for abdominal pain, nausea and vomiting.  Genitourinary: Negative for dysuria and hematuria.  Skin: Negative for rash.      Objective:    BP 110/73   Pulse 89   Wt 172 lb (78 kg)   LMP 11/24/2019 (Exact Date)   Breastfeeding Yes   BMI 31.46 kg/m  Physical Exam Constitutional:      General: She is not in acute distress.    Appearance: She is well-developed.  HENT:     Head: Normocephalic and atraumatic.  Eyes:     General: No scleral icterus. Cardiovascular:     Rate and Rhythm: Normal rate.  Pulmonary:     Effort: Pulmonary effort is normal.  Abdominal:     Palpations: Abdomen is soft.  Genitourinary:    Comments: BUS normal, vagina is pink and rugated, cervix is parous without lesion  Musculoskeletal:     Cervical back: Neck supple.  Skin:    General: Skin is warm and dry.  Neurological:     Mental Status: She is alert and oriented to person, place, and time.    Procedure:  Patient identified, informed consent performed, signed copy in chart, time out was performed.  Urine pregnancy test negative.  Speculum placed in the vagina.  Cervix visualized.  Cleaned with Betadine x 2.  Grasped anteriourly with  a single tooth tenaculum.  Uterus sounded to 8 cm.  Liletta IUD placed per manufacturer's recommendations.  Strings trimmed to 3 cm.   Patient given post procedure instructions and Liletta care card with expiration date.   Urinalysis Small bili Trace protein Small ketones Negative nitrite 2+ blood 3+ leuks      Assessment & Plan:   Problem List Items Addressed This Visit      Unprioritized   TB lung, latent - Primary    No further treatment is needed per ID consult       Other Visit Diagnoses    Hot flashes       Check TSH, FSH.  If in early menopause, lifestyle changes reviewed with patient   Relevant Orders   POCT urine pregnancy (Completed)   Follicle stimulating hormone   TSH   Hematuria, unspecified type       Check urine culture and treat if needed   Relevant Orders   Urine Culture   Encounter for IUD insertion        Patient is asked to check IUD strings periodically and follow up in 4-6 weeks for IUD check.      Total time in review of prior notes, pathology, labs, history taking, review with patient, exam, note writing, discussion of options, plan for next  steps, alternatives and risks of treatment: 30 minutes.  Return in about 4 weeks (around 04/05/2020) for in person, iud check.  Reva Bores 03/08/2020 8:43 PM

## 2020-03-12 LAB — URINE CULTURE

## 2020-03-24 IMAGING — US US MFM FETAL BPP WO NON STRESS
1 series · 13 of 28 positions shown · non-contrast
Comparison: none

[Series 1: us mfm fetal bpp wo non stress · 76 acquisitions, 13 frames shown]
[im 3/76]
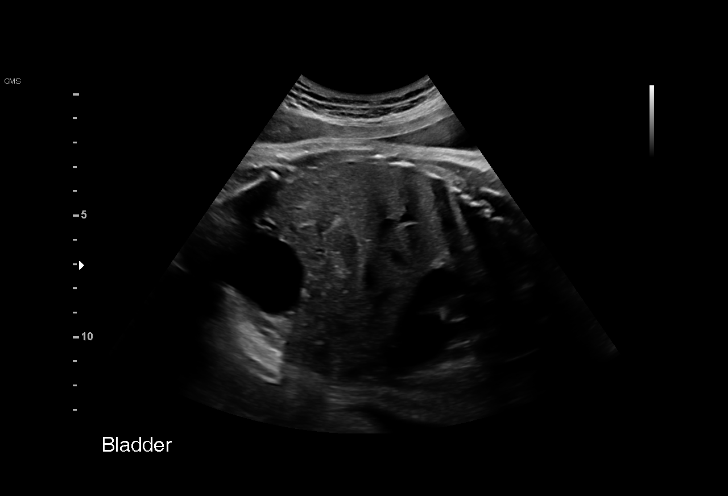
[im 9/76]
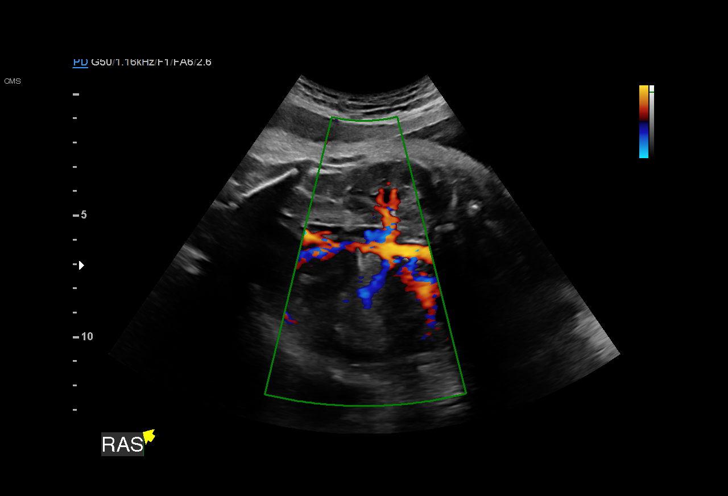
[im 14/76]
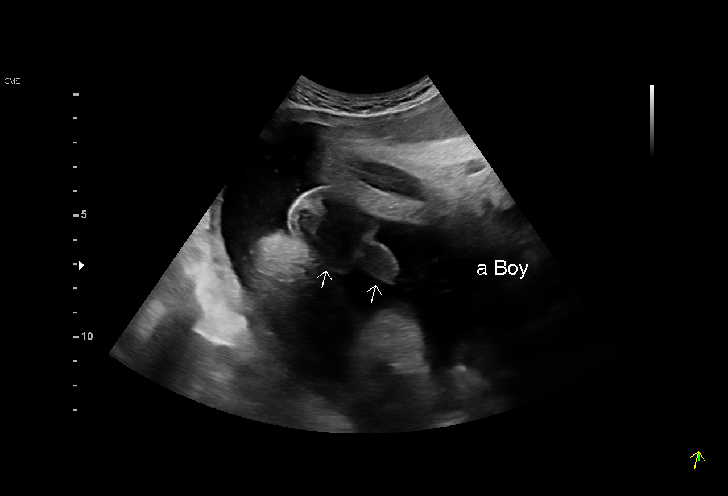
[im 20/76]
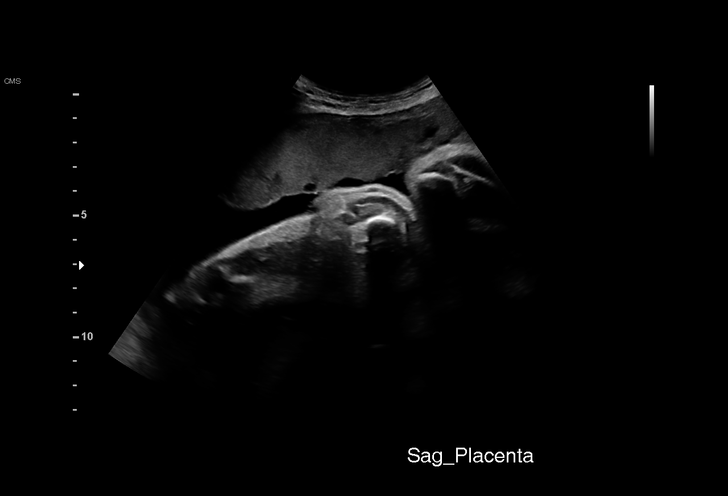
[im 26/76]
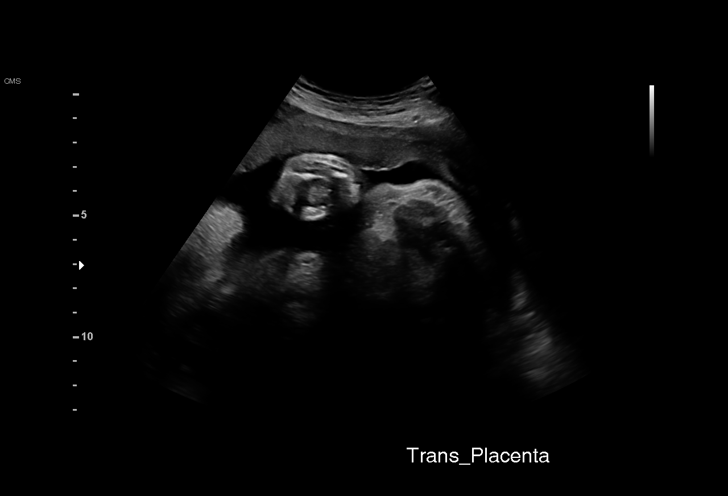
[im 31/76]
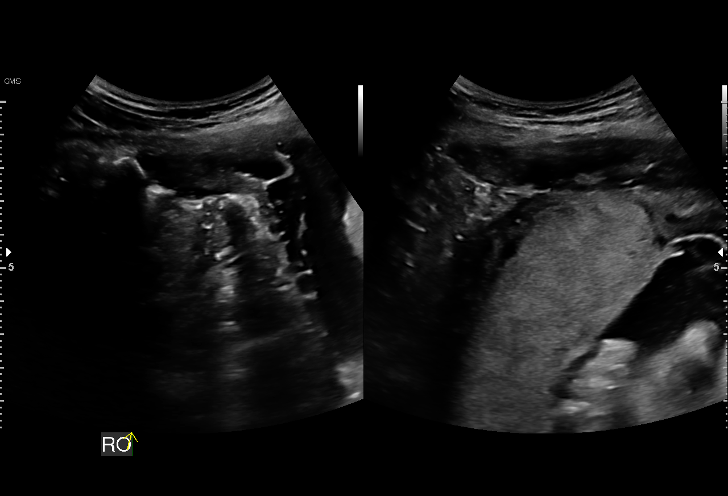
[im 39/76]
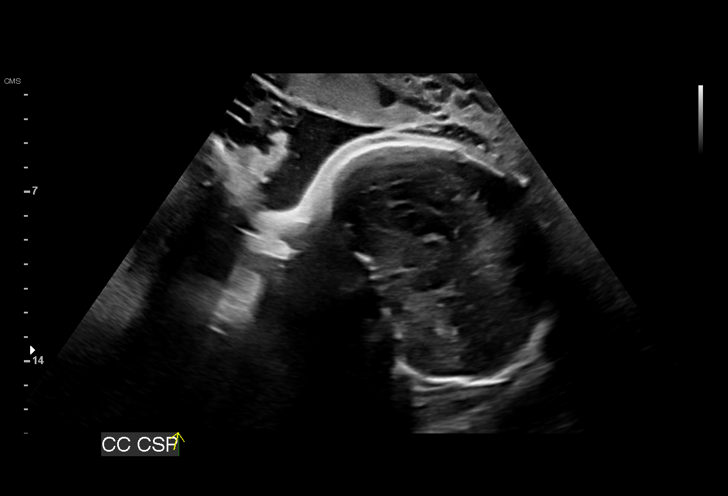
[im 45/76]
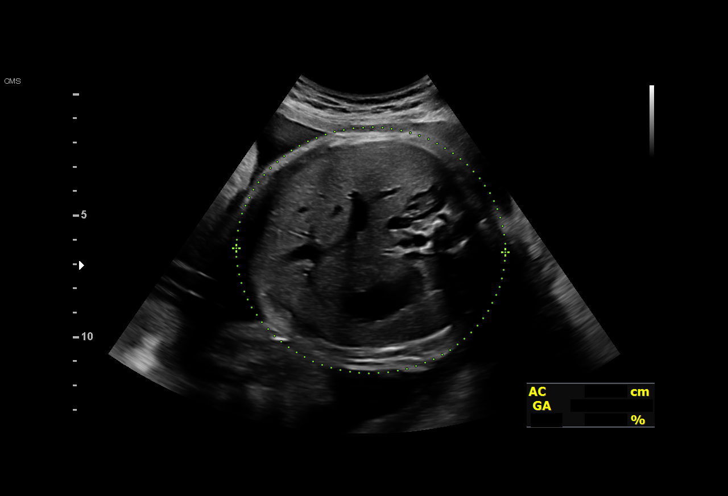
[im 51/76]
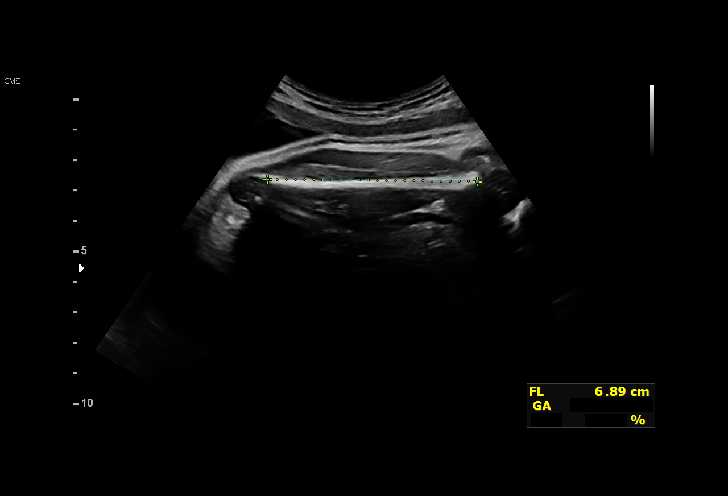
[im 56/76]
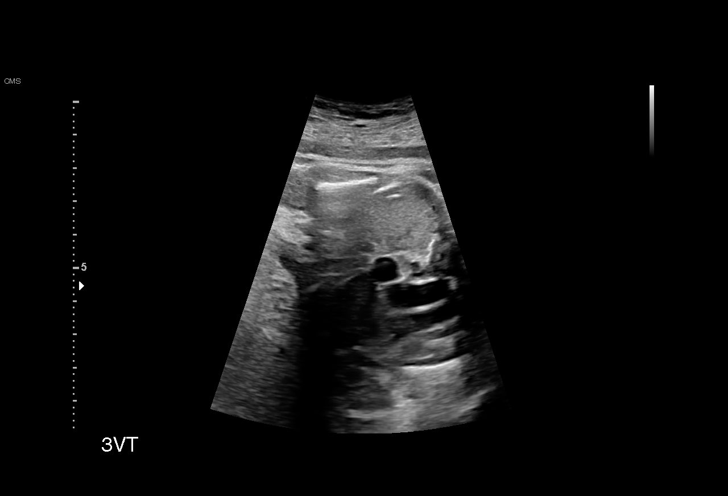
[im 62/76]
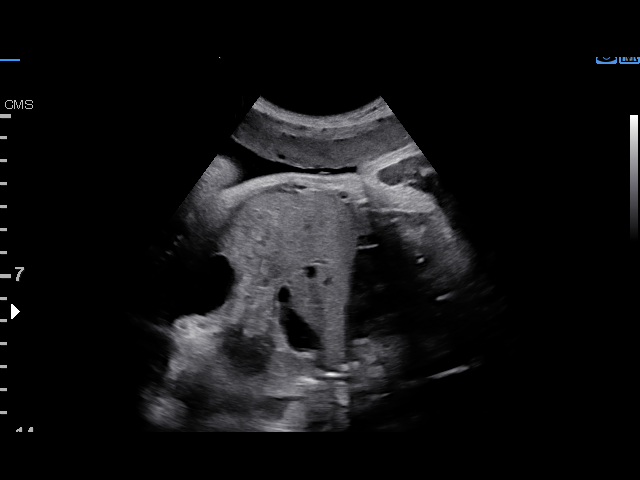
[im 67/76]
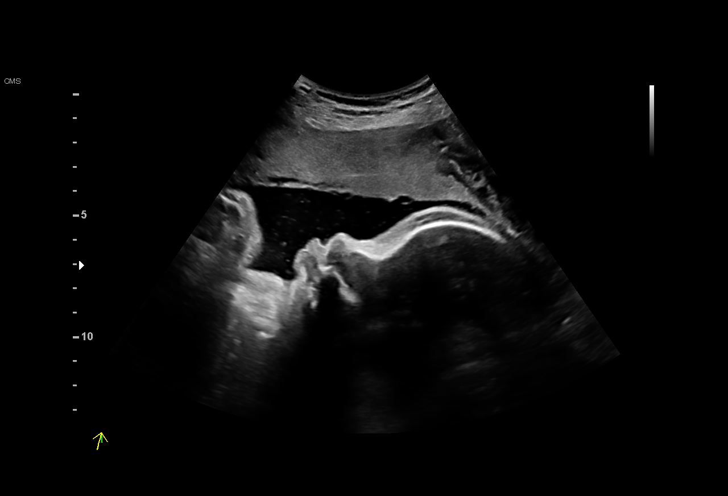
[im 73/76]
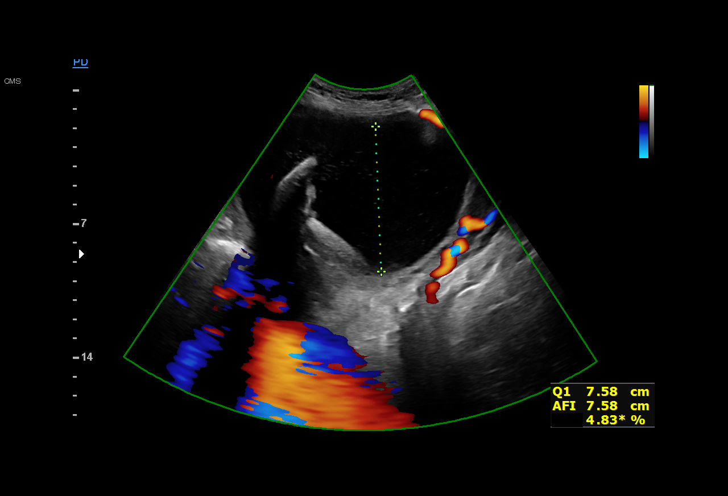

[13 of 28 positions shown; findings below may reference images not displayed]

[address]

 ----------------------------------------------------------------------

 ----------------------------------------------------------------------
Indications

  Advanced maternal age multigravida 35+,
  third trimester - low risk NIPS, neg AFP
  Medical complication of pregnancy (+
  Quantiferon -TB, neg CXR, has been on 4
  months of Rifampin)
  Encounter for other antenatal screening
  follow-up
  36 weeks gestation of pregnancy
  Previous cesarean delivery, antepartum
 ----------------------------------------------------------------------
Fetal Evaluation

 Num Of Fetuses:         1
 Fetal Heart Rate(bpm):  150
 Cardiac Activity:       Observed
 Presentation:           Cephalic

 Amniotic Fluid
 AFI FV:      Polyhydramnios,Subjectively upper-normal

 AFI Sum(cm)     %Tile       Largest Pocket(cm)
 27.14           97

 RUQ(cm)       RLQ(cm)       LUQ(cm)        LLQ(cm)

Biophysical Evaluation

 Amniotic F.V:   Polyhydramnios             F. Tone:        Observed
 F. Movement:    Observed                   Score:          [DATE]
 F. Breathing:   Observed
Biometry

 BPD:      91.4  mm     G. Age:  37w 1d         80  %    CI:        76.21   %    70 - 86
                                                         FL/HC:      20.9   %    20.1 -
 HC:      331.8  mm     G. Age:  37w 6d         59  %    HC/AC:      0.99        0.93 -
 AC:      336.3  mm     G. Age:  37w 4d         89  %    FL/BPD:     75.8   %    71 - 87
 FL:       69.3  mm     G. Age:  35w 4d         28  %    FL/AC:      20.6   %    20 - 24
 HUM:      58.2  mm     G. Age:  33w 5d         21  %

 Est. FW:    5373  gm    6 lb 13 oz      79  %
OB History

 Gravidity:    6         Term:   3        Prem:   0        SAB:   2
 TOP:          0       Ectopic:  0        Living: 3
Gestational Age

 LMP:           36w 2d        Date:  03/07/18                 EDD:   12/12/18
 U/S Today:     37w 0d                                        EDD:   12/07/18
 Best:          36w 2d     Det. By:  LMP  (03/07/18)          EDD:   12/12/18
Anatomy

 Cranium:               Appears normal         Aortic Arch:            Previously seen
 Cavum:                 Previously seen        Ductal Arch:            Previously seen
 Ventricles:            Previously seen        Diaphragm:              Previously seen
 Choroid Plexus:        Previously seen        Stomach:                Appears normal, left
                                                                       sided
 Cerebellum:            Previously seen        Abdomen:                Previously seen
 Posterior Fossa:       Previously seen        Abdominal Wall:         Previously seen
 Nuchal Fold:           Not applicable (>20    Cord Vessels:           Previously seen
                        wks GA)
 Face:                  Orbits and profile     Kidneys:                Appear normal
                        previously seen
 Lips:                  Previously seen        Bladder:                Appears normal
 Thoracic:              Appears normal         Spine:                  Previously seen
 Heart:                 Appears normal         Upper Extremities:      Previously seen
                        (4CH, axis, and si
 RVOT:                  Appears normal         Lower Extremities:      Previously seen
 LVOT:                  Appears normal

 Other:  Heels and 5th digit previously seen. Nasal bone previously seen.
Cervix Uterus Adnexa

 Cervix
 Not visualized (advanced GA >80wks)

 Uterus
 No abnormality visualized.

 Cul De Sac
 No free fluid seen.

 Adnexa
 No abnormality visualized.
Impression

 Advanced maternal age
 Polyhydramnios- seen previously
 Good fetal movement
 Normal interval growth.
Recommendations

 Initiate weekly testing
 Consider delivery at 39 weeks
 Discussed Shivender Jerkovics she works nights so she plans to
 perform Lorenz Jumper when she gets off work.

## 2020-04-05 ENCOUNTER — Other Ambulatory Visit: Payer: Self-pay

## 2020-04-05 ENCOUNTER — Ambulatory Visit (INDEPENDENT_AMBULATORY_CARE_PROVIDER_SITE_OTHER): Payer: Medicaid Other

## 2020-04-05 VITALS — BP 119/83 | HR 90 | Wt 170.0 lb

## 2020-04-05 DIAGNOSIS — Z30431 Encounter for routine checking of intrauterine contraceptive device: Secondary | ICD-10-CM | POA: Diagnosis not present

## 2020-04-05 NOTE — Progress Notes (Signed)
Pt presents for  Liletta IUD Check today.   CC:  Pt states last visit she was told she would have a Rx sent pt states no Rx was sent for possible UTI .Per last notes was to send urine Cx then tx if needed urine Cx was Negative from 09/16.21. provider noted no UTI in Chart.   Pt denies any pain with IUD no vaginal bleeding.

## 2020-04-05 NOTE — Progress Notes (Signed)
    GYNECOLOGY OFFICE ENCOUNTER NOTE  History:  Nancy Miles is a 44 y.o. [id number] here today for today for IUD string check; Liletta  IUD was placed  Sept 16, 2021. No complaints about the IUD, no concerning side effects.  She has not engaged in sexual activity since placement.   The following portions of the patient's history were reviewed and updated as appropriate: allergies, current medications, past family history, past medical history, past social history, past surgical history and problem list. Last pap smear on 05/2018 was normal, negative HRHPV.  Review of Systems:  Pertinent items are noted in HPI.   Objective:  Physical Exam Blood pressure 119/83, pulse 90, weight 170 lb (77.1 kg), currently breastfeeding. CONSTITUTIONAL: Well-developed, well-nourished female in no acute distress.  HENT:  Normocephalic, atraumatic. External right and left ear normal. Oropharynx is clear and moist EYES: Conjunctivae and EOM are normal. Pupils are equal, round, and reactive to light. No scleral icterus.  NECK: Normal range of motion, supple, no masses CARDIOVASCULAR: Normal heart rate noted RESPIRATORY: Effort and breath sounds normal, no problems with respiration noted ABDOMEN: Soft, no distention noted.   PELVIC: Type 2 Female Circumcision. Normal appearing vaginal mucosa and cervix.  IUD strings visualized, about 5 cm in length outside cervix. Cut to ~ 3cm without issues.  BME performed and no IUD palpated at cervical os.   Assessment & Plan:  1. Encounter for routine checking of intrauterine contraceptive device (IUD) -Informed that IUD in place and without concerns. -Discussed possible changes in menstrual cycle. -Patient questions why she had blood in her urine on previous visit. -Informed that she should follow up with PCP, but that UC was negative. -Encouraged to monitor.  -F/U as needed and yearly for WWE.   Cherre Robins MSN, CNM Advanced Practice Provider, [facility]

## 2020-04-07 IMAGING — US US MFM FETAL BPP WO NON STRESS
1 series · 15 of 28 positions shown · non-contrast
Comparison: none

[Series 1: us mfm fetal bpp wo non stress · 31 acquisitions, 15 frames shown]
[im 1/31]
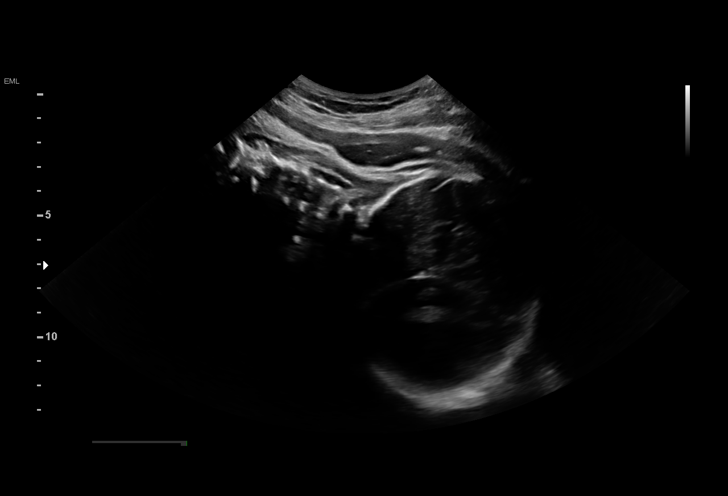
[im 3/31]
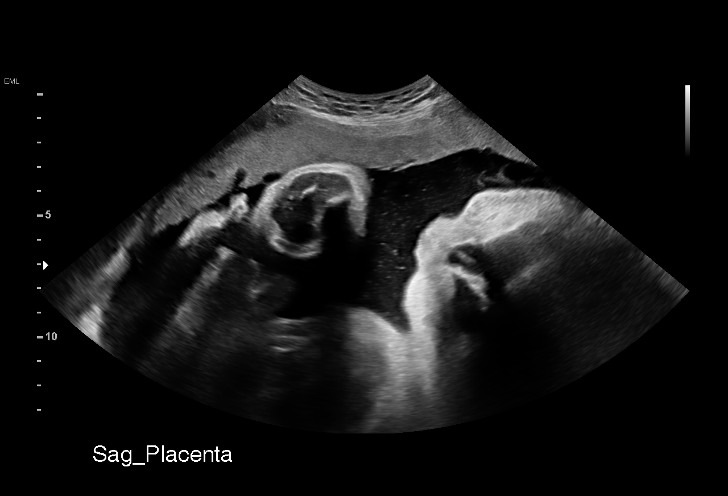
[im 5/31]
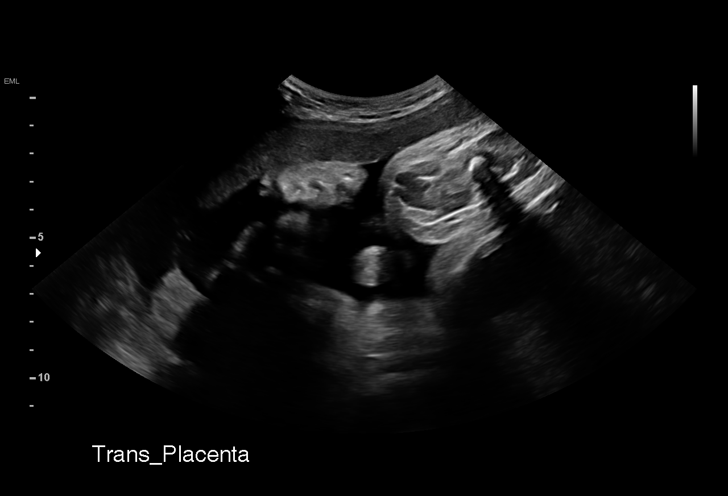
[im 7/31]
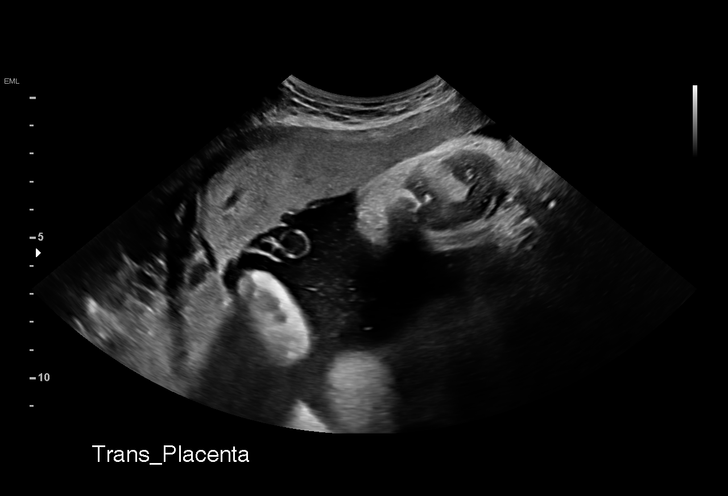
[im 9/31]
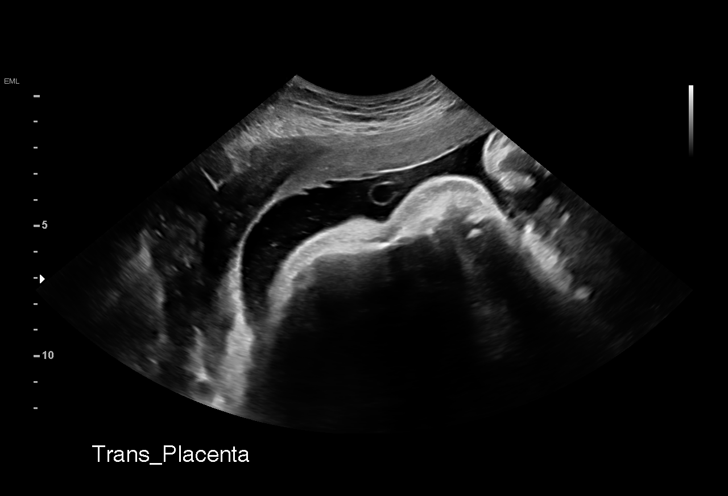
[im 12/31]
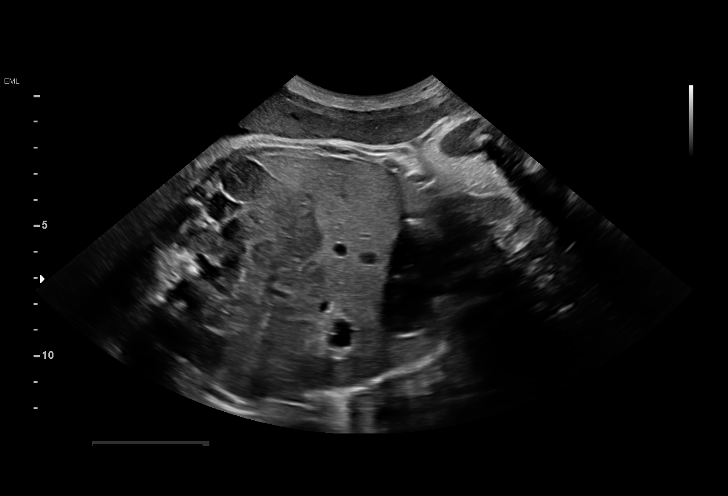
[im 14/31]
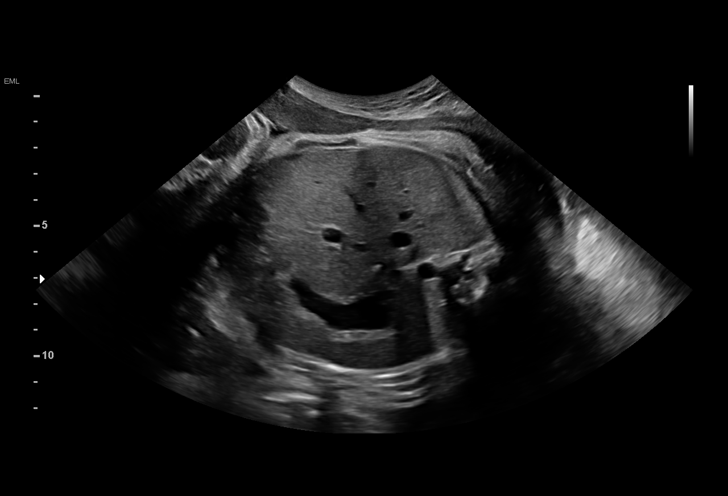
[im 16/31]
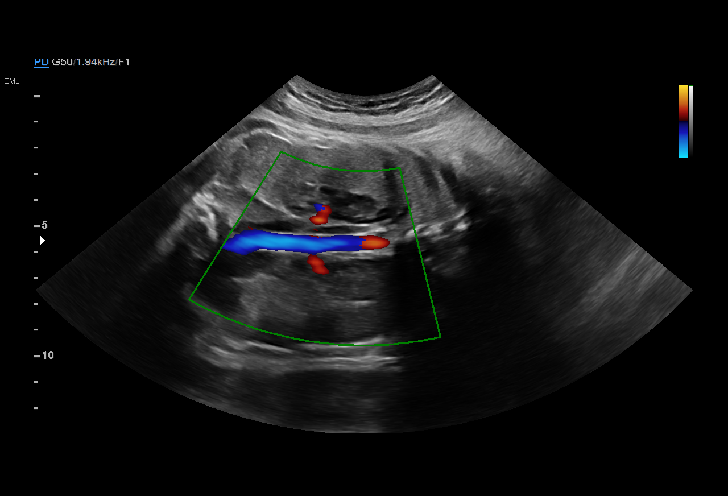
[im 17/31]
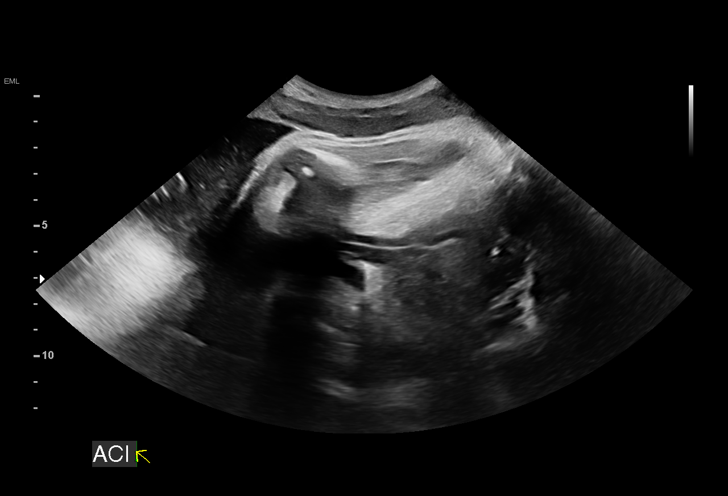
[im 19/31]
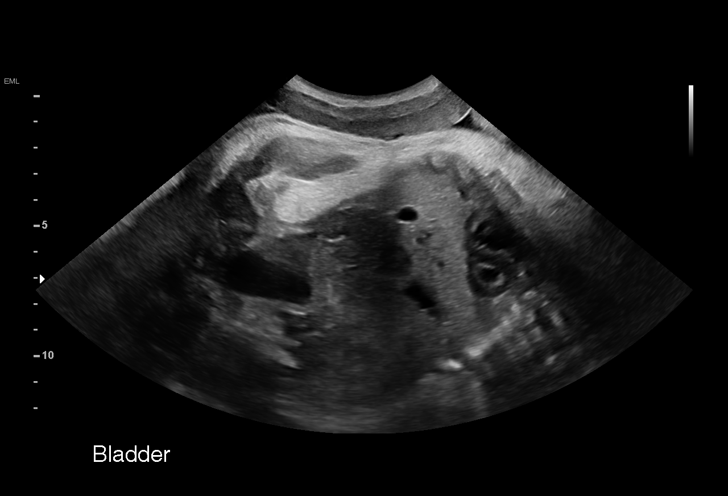
[im 22/31]
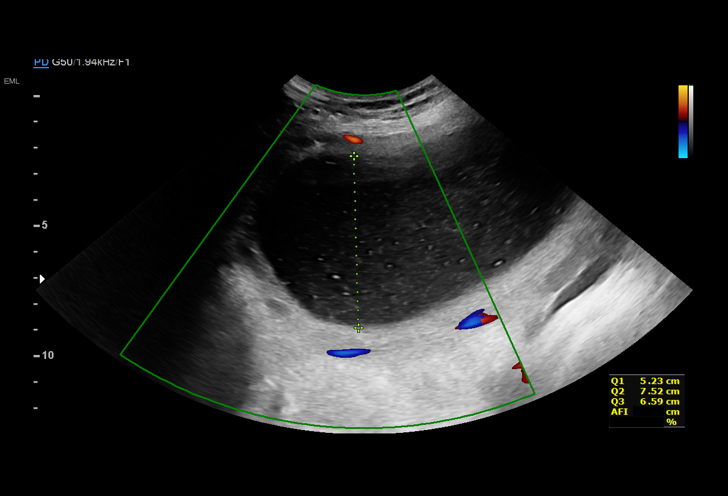
[im 24/31]
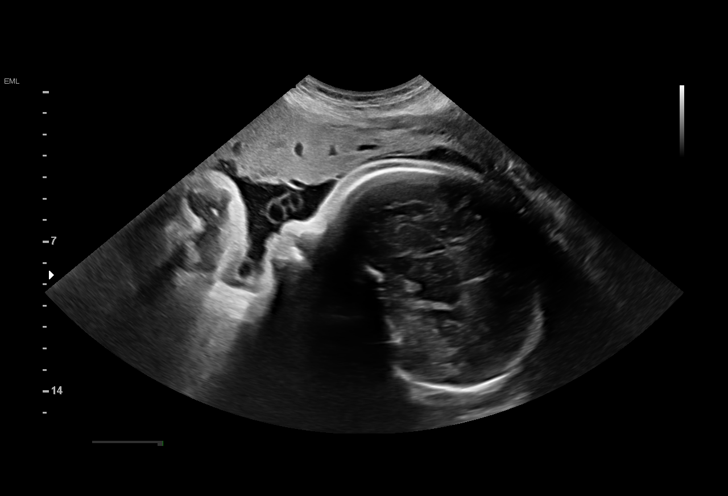
[im 26/31]
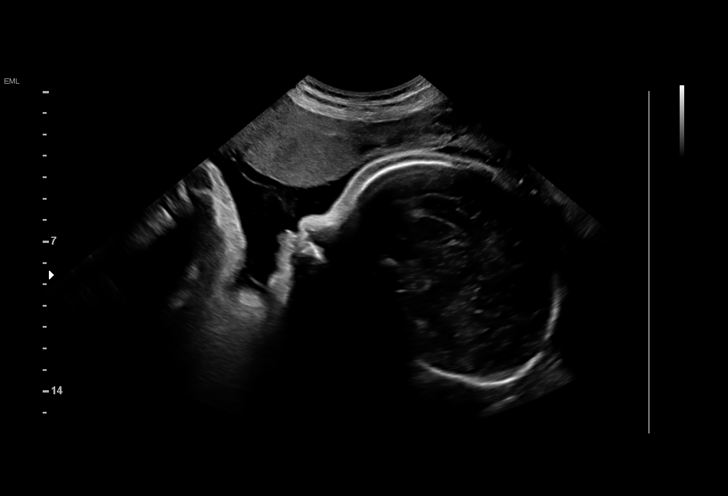
[im 28/31]
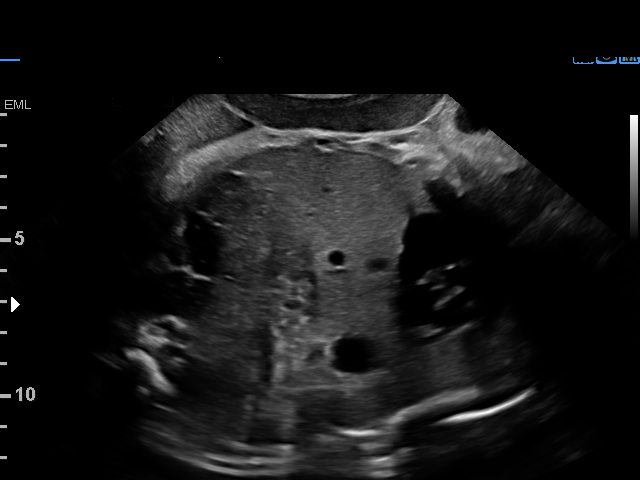
[im 31/31]
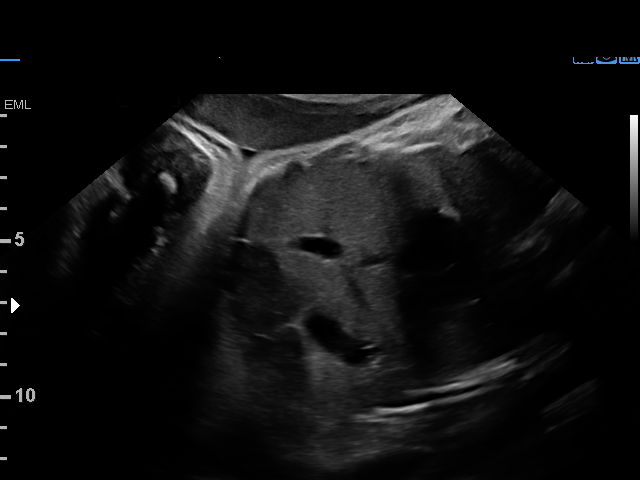

[15 of 28 positions shown; findings below may reference images not displayed]

[address]

     STRESS                                            RUDI
 ----------------------------------------------------------------------

 ----------------------------------------------------------------------
Indications

  38 weeks gestation of pregnancy
  Advanced maternal age multigravida 35+,
  third trimester - low risk NIPS, neg AFP
  Medical complication of pregnancy (+
  Quantiferon -TB, neg CXR, has been on 4
  months of Rifampin)
  Encounter for other antenatal screening
  follow-up
  Previous cesarean delivery, antepartum
 ----------------------------------------------------------------------
Vital Signs

 BMI:
Fetal Evaluation

 Num Of Fetuses:         1
 Fetal Heart Rate(bpm):  141
 Cardiac Activity:       Observed
 Presentation:           Cephalic
 Placenta:               Anterior
 P. Cord Insertion:      Visualized, central

 Amniotic Fluid
 AFI FV:      Polyhydramnios

 AFI Sum(cm)     %Tile       Largest Pocket(cm)
 26.03           97

 RUQ(cm)       RLQ(cm)       LUQ(cm)        LLQ(cm)

Biophysical Evaluation

 Amniotic F.V:   Polyhydramnios             F. Tone:        Observed
 F. Movement:    Observed                   Score:          [DATE]
 F. Breathing:   Observed
OB History

 Gravidity:    6         Term:   3        Prem:   0        SAB:   2
 TOP:          0       Ectopic:  0        Living: 3
Gestational Age

 LMP:           38w 2d        Date:  03/07/18                 EDD:   12/12/18
 Best:          38w 2d     Det. By:  LMP  (03/07/18)          EDD:   12/12/18
Anatomy

 Diaphragm:             Appears normal         Kidneys:                Appear normal
 Stomach:               Appears normal, left   Bladder:                Appears normal
                        sided
Impression

 Advanced maternal age. Patient returned for antenatal testing.
 Mild polyhydramnios is seen. Antenatal testing is reassuring.
 BPP [DATE].

 Mild polyhydramnios is not usually associated with adverse
 outcomes.
Recommendations

 -Continue weekly BPP till delivery.
                 [place], [place]

## 2020-05-08 ENCOUNTER — Other Ambulatory Visit: Payer: Medicaid Other

## 2020-05-08 DIAGNOSIS — Z20822 Contact with and (suspected) exposure to covid-19: Secondary | ICD-10-CM

## 2020-05-09 LAB — SARS-COV-2, NAA 2 DAY TAT

## 2020-05-09 LAB — NOVEL CORONAVIRUS, NAA: SARS-CoV-2, NAA: NOT DETECTED

## 2020-07-25 ENCOUNTER — Other Ambulatory Visit: Payer: Self-pay | Admitting: Obstetrics and Gynecology

## 2020-07-25 DIAGNOSIS — Z1231 Encounter for screening mammogram for malignant neoplasm of breast: Secondary | ICD-10-CM

## 2020-09-10 ENCOUNTER — Other Ambulatory Visit: Payer: Self-pay

## 2020-09-10 ENCOUNTER — Inpatient Hospital Stay: Admission: RE | Admit: 2020-09-10 | Payer: Medicaid Other | Source: Ambulatory Visit

## 2020-09-10 ENCOUNTER — Ambulatory Visit
Admission: RE | Admit: 2020-09-10 | Discharge: 2020-09-10 | Disposition: A | Discharge status: Home / Self Care | Payer: Medicaid Other | Source: Ambulatory Visit | Attending: Obstetrics and Gynecology | Admitting: Obstetrics and Gynecology

## 2020-09-10 DIAGNOSIS — Z1231 Encounter for screening mammogram for malignant neoplasm of breast: Secondary | ICD-10-CM

## 2020-09-11 ENCOUNTER — Ambulatory Visit: Payer: Medicaid Other

## 2020-09-12 ENCOUNTER — Other Ambulatory Visit: Payer: Self-pay | Admitting: Obstetrics and Gynecology

## 2020-09-12 DIAGNOSIS — R928 Other abnormal and inconclusive findings on diagnostic imaging of breast: Secondary | ICD-10-CM

## 2020-10-03 ENCOUNTER — Other Ambulatory Visit: Payer: Medicaid Other

## 2020-11-02 ENCOUNTER — Ambulatory Visit
Admission: RE | Admit: 2020-11-02 | Discharge: 2020-11-02 | Disposition: A | Discharge status: Home / Self Care | Payer: Medicaid Other | Source: Ambulatory Visit | Attending: Obstetrics and Gynecology | Admitting: Obstetrics and Gynecology

## 2020-11-02 ENCOUNTER — Other Ambulatory Visit: Payer: Self-pay

## 2020-11-02 ENCOUNTER — Other Ambulatory Visit: Payer: Self-pay | Admitting: Obstetrics and Gynecology

## 2020-11-02 DIAGNOSIS — R928 Other abnormal and inconclusive findings on diagnostic imaging of breast: Secondary | ICD-10-CM

## 2020-11-06 ENCOUNTER — Other Ambulatory Visit: Payer: Self-pay | Admitting: Obstetrics and Gynecology

## 2020-11-06 ENCOUNTER — Other Ambulatory Visit: Payer: Self-pay

## 2020-11-06 ENCOUNTER — Ambulatory Visit
Admission: RE | Admit: 2020-11-06 | Discharge: 2020-11-06 | Disposition: A | Discharge status: Home / Self Care | Payer: Medicaid Other | Source: Ambulatory Visit | Attending: Obstetrics and Gynecology | Admitting: Obstetrics and Gynecology

## 2020-11-06 DIAGNOSIS — N6320 Unspecified lump in the left breast, unspecified quadrant: Secondary | ICD-10-CM

## 2020-11-06 DIAGNOSIS — R928 Other abnormal and inconclusive findings on diagnostic imaging of breast: Secondary | ICD-10-CM

## 2021-04-24 ENCOUNTER — Ambulatory Visit (HOSPITAL_COMMUNITY)
Admission: EM | Admit: 2021-04-24 | Discharge: 2021-04-24 | Disposition: A | Discharge status: Home / Self Care | Payer: Medicaid Other | Attending: Internal Medicine | Admitting: Internal Medicine

## 2021-04-24 ENCOUNTER — Other Ambulatory Visit: Payer: Self-pay

## 2021-04-24 ENCOUNTER — Encounter (HOSPITAL_COMMUNITY): Payer: Self-pay | Admitting: Emergency Medicine

## 2021-04-24 DIAGNOSIS — R519 Headache, unspecified: Secondary | ICD-10-CM | POA: Insufficient documentation

## 2021-04-24 DIAGNOSIS — Z793 Long term (current) use of hormonal contraceptives: Secondary | ICD-10-CM | POA: Diagnosis not present

## 2021-04-24 DIAGNOSIS — Z20822 Contact with and (suspected) exposure to covid-19: Secondary | ICD-10-CM | POA: Insufficient documentation

## 2021-04-24 DIAGNOSIS — B9789 Other viral agents as the cause of diseases classified elsewhere: Secondary | ICD-10-CM | POA: Insufficient documentation

## 2021-04-24 DIAGNOSIS — J029 Acute pharyngitis, unspecified: Secondary | ICD-10-CM | POA: Diagnosis not present

## 2021-04-24 DIAGNOSIS — Z79890 Hormone replacement therapy (postmenopausal): Secondary | ICD-10-CM | POA: Insufficient documentation

## 2021-04-24 LAB — POC INFLUENZA A AND B ANTIGEN (URGENT CARE ONLY)
INFLUENZA A ANTIGEN, POC: NEGATIVE
INFLUENZA B ANTIGEN, POC: NEGATIVE

## 2021-04-24 MED ORDER — CEPACOL 15-2.3 MG MT LOZG: 1.0000 | LOZENGE | OROMUCOSAL | Status: DC | PRN

## 2021-04-24 MED ORDER — PHENOL 1.4 % MT LIQD: 1.0000 | OROMUCOSAL | 0 refills | Status: DC | PRN

## 2021-04-24 NOTE — Discharge Instructions (Signed)
Warm salt water gargle Chloraseptic throat spray Cepacol lozenges Maintain adequate hydration We will call you with recommendations if labs are abnormal Return to urgent care if you have worsening symptoms.

## 2021-04-24 NOTE — ED Triage Notes (Signed)
Complains of headache, sore throat and ear pain, swallowing hurts in both ears. Symptoms started yesterday

## 2021-04-25 LAB — SARS CORONAVIRUS 2 (TAT 6-24 HRS): SARS Coronavirus 2: NEGATIVE

## 2021-04-25 NOTE — ED Provider Notes (Signed)
[facility]    CSN: [id number] Arrival date & time: 04/24/21  1139      History   Chief Complaint Chief Complaint  Patient presents with   Sore Throat    HPI Nancy Miles is a 45 y.o. female comes to the urgent care with 1 day history of sore throat and bilateral ear pain.  Symptom onset was fairly sudden and has been persistent.  Patient denies any fever or chills.  No generalized body aches.  She has a headache.  No nausea, vomiting or diarrhea.  No sick contacts.   HPI  Past Medical History:  Diagnosis Date   Headache    Irregular menses    from PCP notes   Low back pain    from PCP notes   Supervision of high risk pregnancy, antepartum 06/11/2018   .Marland Kitchen Nursing Staff Provider Office Location [facility] Dating  LMP Language   English & [employer]igr[employer] An[employer] US   Flu Vaccine   Genetic Screen  NIPS:  Low risk   TDaP vaccine    Hgb A1C or  GTT Early  Third trimester  Rhogam     LAB RESULTS  Feeding Plan Breast/Bottle Blood Type O/Positive/-- (12/20 0959)  Contraception Undecided Antibody Negative (12/20 0959) Circumcision  Rubella 3.16 (12/20 0959) Pe   TB lung, 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 Authorizing Provider  Benzocaine-Menthol (CEPACOL) 15-2.3 MG LOZG Use as directed 1 lozenge in the mouth or throat as needed. 04/24/21  Yes Madoline Bhatt, Britta Mccreedy, MD  phenol (CHLORASEPTIC) 1.4 % LIQD Use as directed 1 spray in the mouth or throat as needed for  throat irritation / pain. 04/24/21  Yes Shaunte Tuft, Britta Mccreedy, MD  Acetaminophen (TYLENOL PO) Take by mouth.    [provider]  ibuprofen (ADVIL) 600 MG tablet Take 1 tablet (600 mg total) by mouth every 6 (six) hours as needed. 01/19/20   Eustace Moore, MD  levonorgestrel (LILETTA, 52 MG,) 19.5 MCG/DAY IUD IUD 1 each by Intrauterine route once.    [provider]  levothyroxine (SYNTHROID) 25 MCG tablet Take 25 mcg by mouth daily before breakfast.    [provider]  Prenatal Vit-Fe Phos-FA-Omega (VITAFOL GUMMIES) 3.33-0.333-34.8 MG CHEW Chew 1 tablet by mouth daily. 10/12/19   Brock Bad, MD  VITAMIN D PO Take by mouth.    [provider]    Family History Family History  Problem Relation Age of Onset   Healthy Mother    Asthma Mother    Other Neg Hx    Migraines Neg Hx     Social History Social History   Tobacco Use  Smoking status: Never   Smokeless tobacco: Never  Vaping Use   Vaping Use: Never used  Substance Use Topics   Alcohol use: No   Drug use: No     Allergies   Patient has no known allergies.   Review of Systems Review of Systems  HENT:  Positive for sore throat. Negative for congestion.   Respiratory: Negative.  Negative for cough, shortness of breath and wheezing.   Gastrointestinal: Negative.   Neurological:  Positive for headaches.    Physical Exam Triage Vital Signs ED Triage Vitals  Enc Vitals Group     BP 04/24/21 1354 112/76     Pulse Rate 04/24/21 1354 91     Resp 04/24/21 1354 18     Temp 04/24/21 1354 98.8 F (37.1 C)     Temp src --      SpO2 04/24/21 1354 98 %     Weight --      Height --      Head Circumference --      Peak Flow --      Pain Score 04/24/21 1351 6     Pain Loc --      Pain Edu? --      Excl. in GC? --    No data found.  Updated Vital Signs BP 112/76 (BP Location: Left Arm)   Pulse 91   Temp 98.8 F (37.1 C)   Resp 18   SpO2 98%   Visual Acuity Right Eye  Distance:   Left Eye Distance:   Bilateral Distance:    Right Eye Near:   Left Eye Near:    Bilateral Near:     Physical Exam Vitals and nursing note reviewed.  Constitutional:      General: She is not in acute distress.    Appearance: She is not ill-appearing.  HENT:     Right Ear: Tympanic membrane normal. No drainage.     Left Ear: Tympanic membrane normal. No drainage.     Mouth/Throat:     Tonsils: No tonsillar exudate or tonsillar abscesses. 1+ on the right. 1+ on the left.  Cardiovascular:     Rate and Rhythm: Normal rate and regular rhythm.  Pulmonary:     Effort: Pulmonary effort is normal.     Breath sounds: Normal breath sounds.  Neurological:     Mental Status: She is alert.     UC Treatments / Results  Labs (all labs ordered are listed, but only abnormal results are displayed) Labs Reviewed  SARS CORONAVIRUS 2 (TAT 6-24 HRS)  POC INFLUENZA A AND B ANTIGEN (URGENT CARE ONLY)    EKG   Radiology No results found.  Procedures Procedures (including critical care time)  Medications Ordered in UC Medications - No data to display  Initial Impression / Assessment and Plan / UC Course  I have reviewed the triage vital signs and the nursing notes.  Pertinent labs & imaging results that were available during my care of the patient were reviewed by me and considered in my medical decision making (see chart for details).     1.  Acute viral pharyngitis: Point-of-care flu test is negative for influenza A/B COVID-19 PCR test has been sent Maintain adequate hydration Tylenol/Motrin as needed for fever Chloraseptic throat spray Return to urgent care if symptoms worsen We will call you with recommendations if labs are abnormal. Final Clinical Impressions(s) / UC Diagnoses   Final diagnoses:  Acute viral pharyngitis     Discharge  Instructions      Warm salt water gargle Chloraseptic throat spray Cepacol lozenges Maintain adequate hydration We will  call you with recommendations if labs are abnormal Return to urgent care if you have worsening symptoms.   ED Prescriptions     Medication Sig Dispense Auth. Provider   phenol (CHLORASEPTIC) 1.4 % LIQD Use as directed 1 spray in the mouth or throat as needed for throat irritation / pain. -- Merrilee Jansky, MD   Benzocaine-Menthol (CEPACOL) 15-2.3 MG LOZG Use as directed 1 lozenge in the mouth or throat as needed. -- Merrilee Jansky, MD      PDMP not reviewed this encounter.   Merrilee Jansky, MD 04/25/21 [phone]

## 2021-06-04 IMAGING — MG MM BREAST LOCALIZATION CLIP
4 series · 4 of 12 positions shown · non-contrast
Comparison: Previous exam(s).

CLINICAL DATA: Status post ultrasound-guided core biopsy of a right
breast mass.

EXAM:
DIAGNOSTIC RIGHT MAMMOGRAM POST ULTRASOUND BIOPSY

[R ML synth-2D]
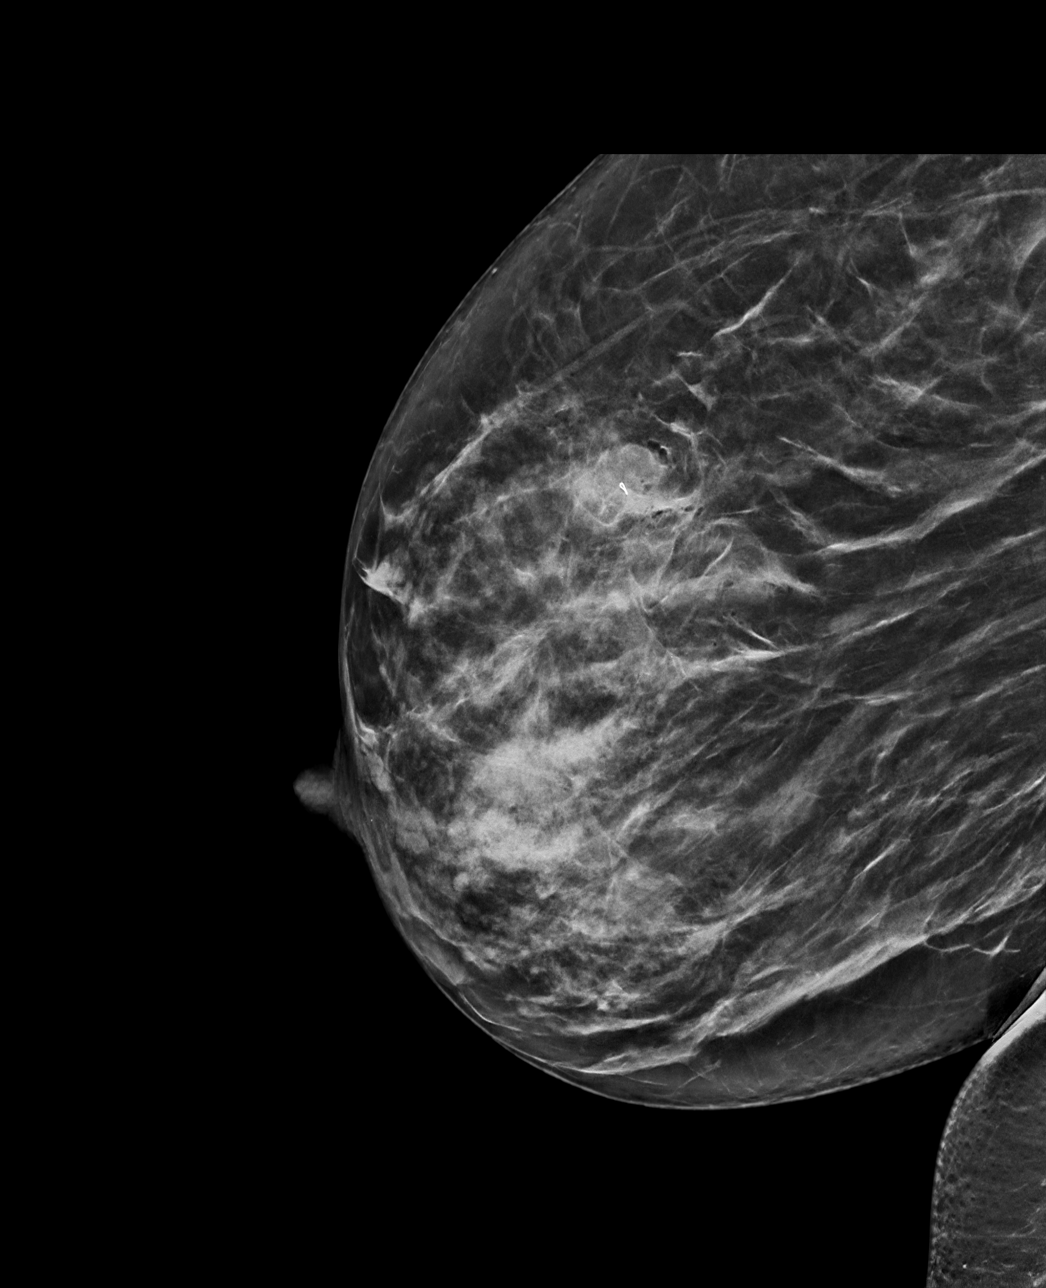

[R CC synth-2D]
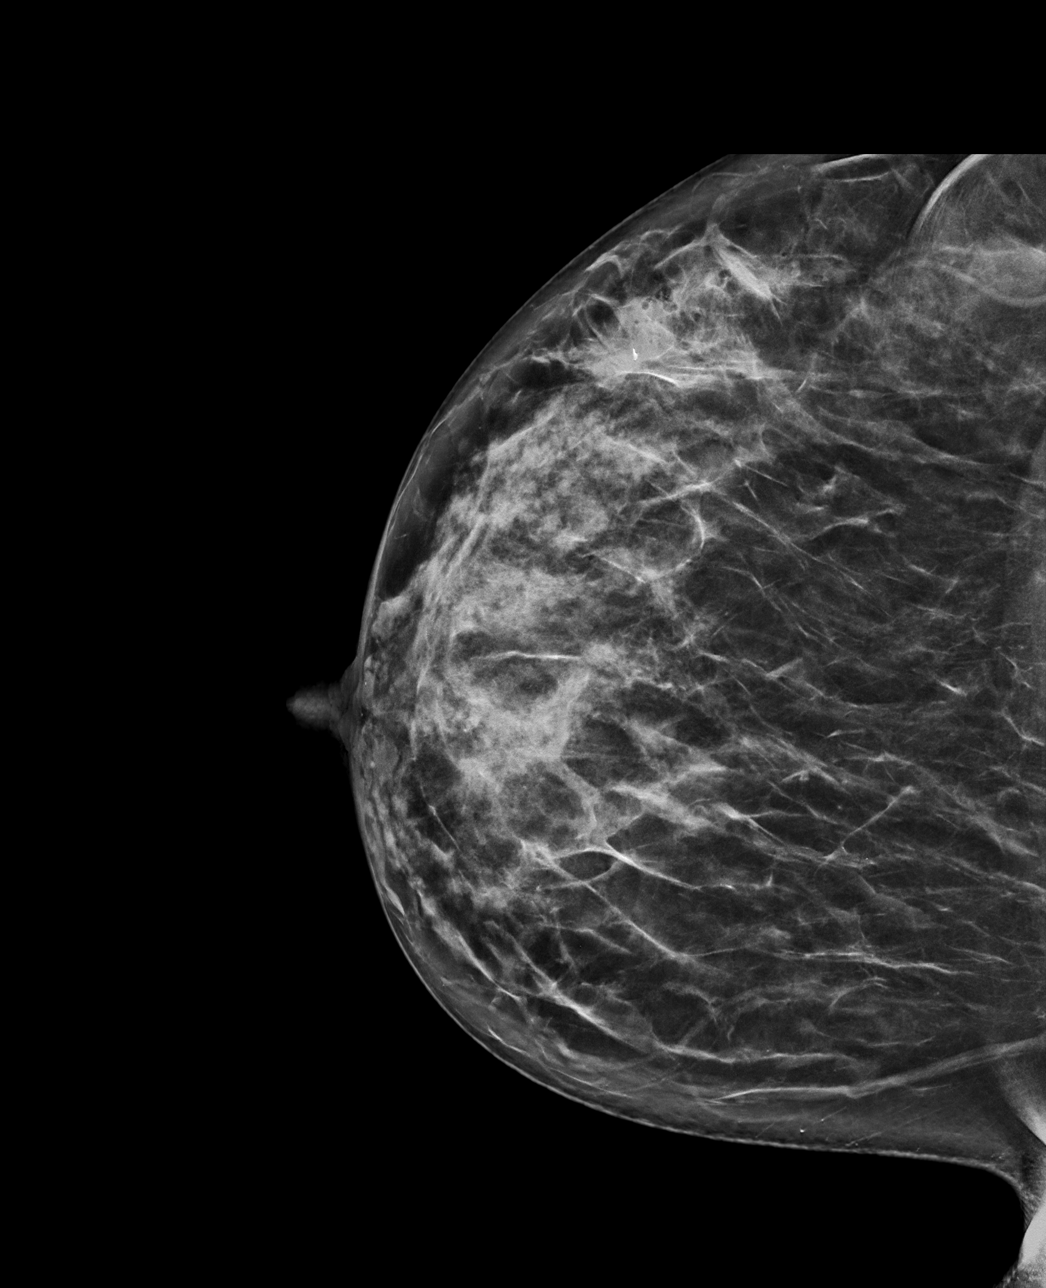

[R CC tomo · tomo slice 41/80.0]
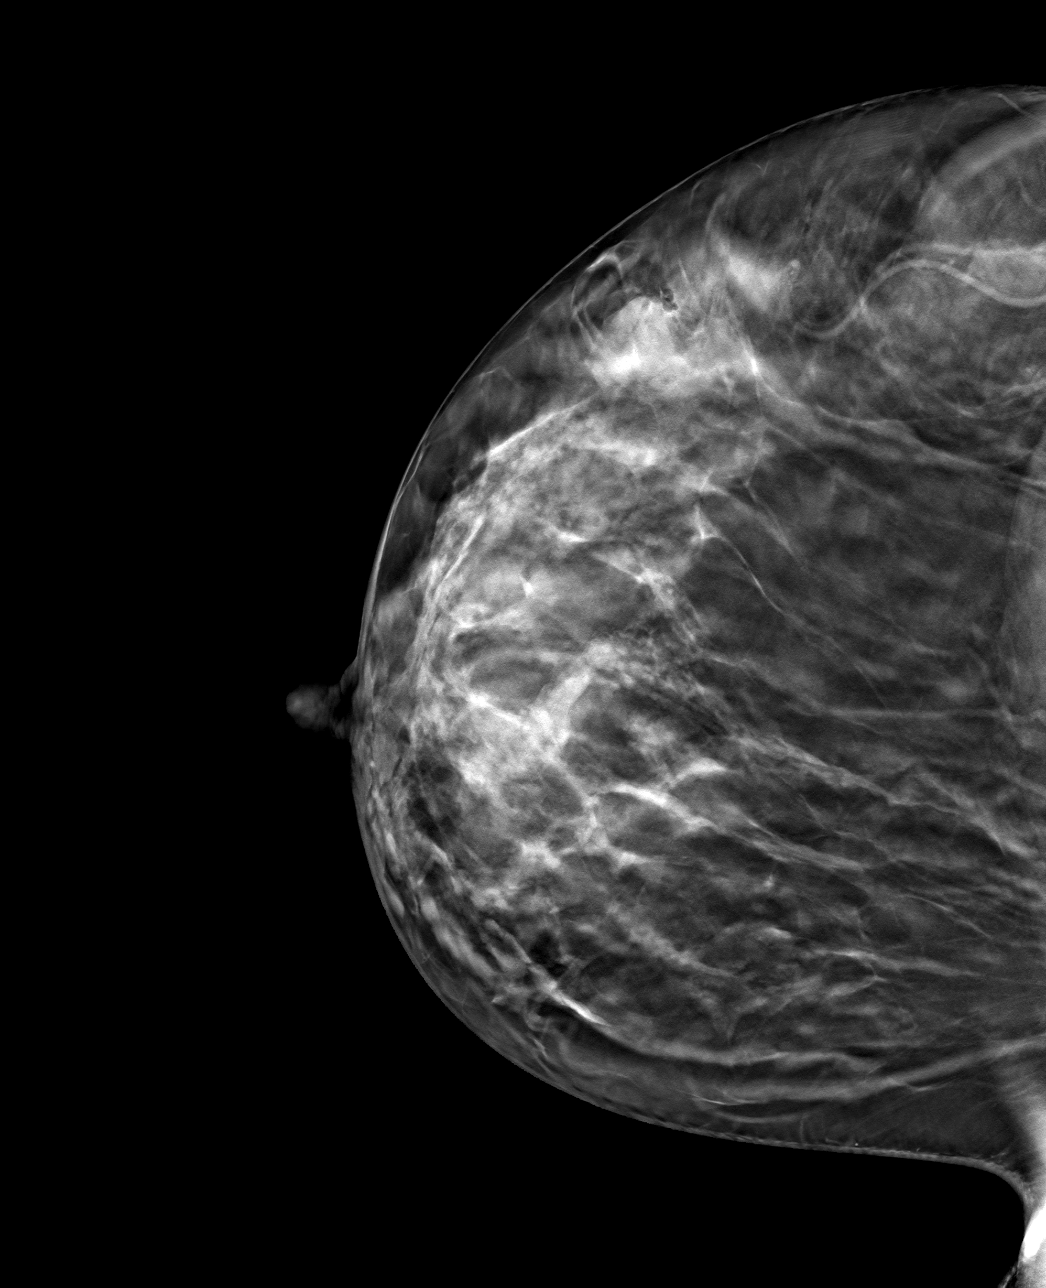

[R ML tomo · tomo slice 37/73.0]
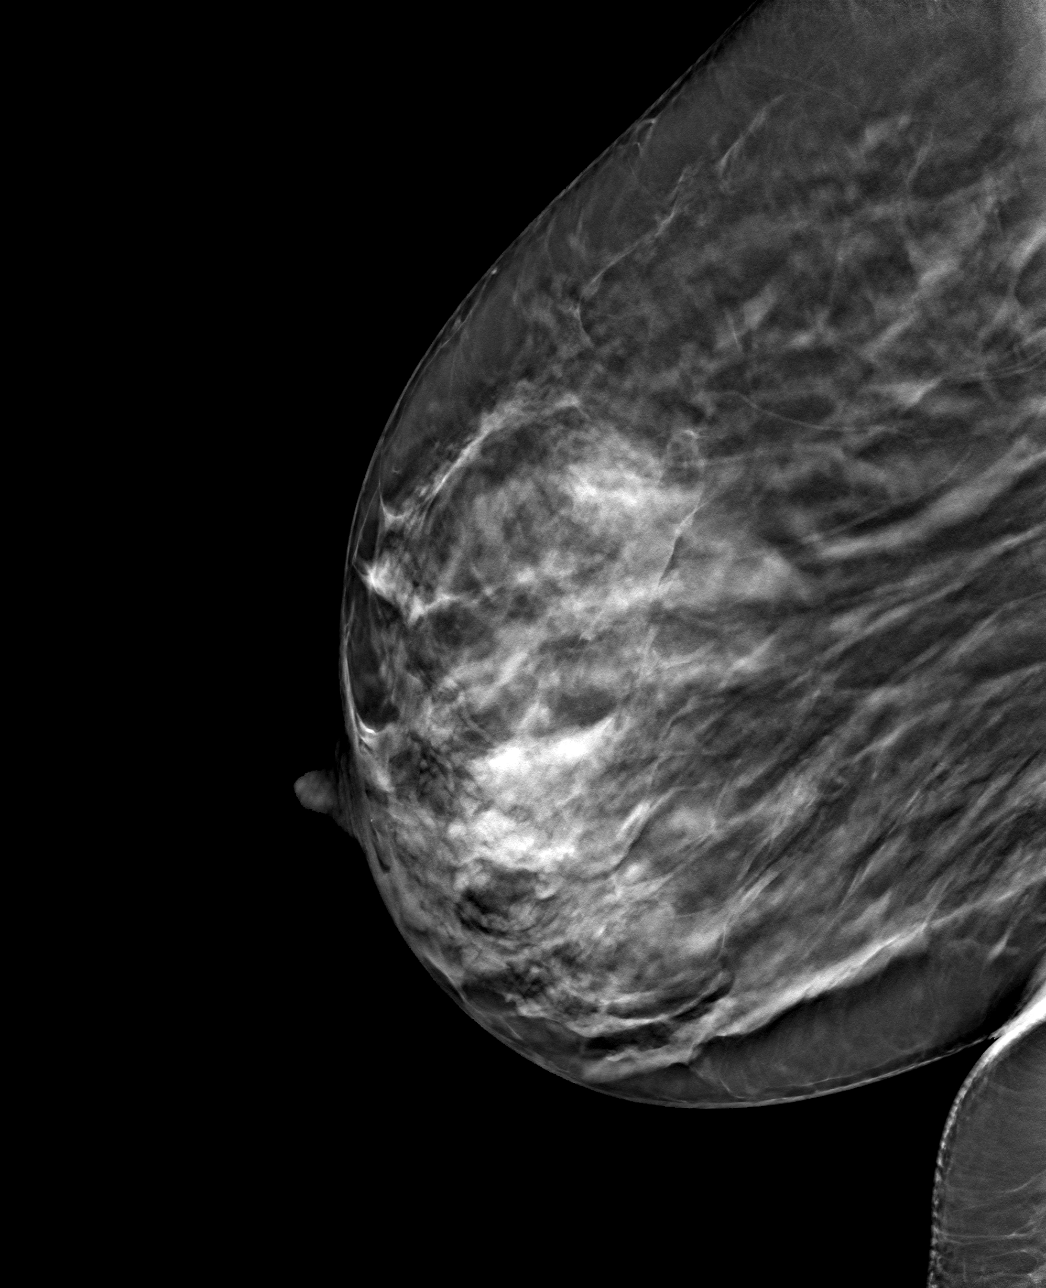

[4 of 12 positions shown; findings below may reference images not displayed]

FINDINGS: Mammographic images were obtained following ultrasound guided biopsy
of a mass in the 10 o'clock region of the right breast. The biopsy
marking clip is in the expected location in the upper-outer quadrant
of the right breast.
IMPRESSION: Appropriate positioning of the ribbon shaped biopsy marking clip at
the site of biopsy in the upper-outer quadrant of the right breast.

Final Assessment: Post Procedure Mammograms for Marker Placement

## 2021-09-17 ENCOUNTER — Ambulatory Visit (HOSPITAL_COMMUNITY)
Admission: EM | Admit: 2021-09-17 | Discharge: 2021-09-17 | Disposition: A | Discharge status: Home / Self Care | Payer: Medicaid Other | Attending: Internal Medicine | Admitting: Internal Medicine

## 2021-09-17 ENCOUNTER — Other Ambulatory Visit: Payer: Self-pay

## 2021-09-17 ENCOUNTER — Encounter (HOSPITAL_COMMUNITY): Payer: Self-pay | Admitting: Emergency Medicine

## 2021-09-17 DIAGNOSIS — M546 Pain in thoracic spine: Secondary | ICD-10-CM | POA: Diagnosis not present

## 2021-09-17 MED ORDER — NAPROXEN 250 MG PO TABS: 500.0000 mg | ORAL_TABLET | Freq: Two times a day (BID) | ORAL | 0 refills | Status: DC

## 2021-09-17 MED ORDER — KETOROLAC TROMETHAMINE 30 MG/ML IJ SOLN
30.0000 mg | Freq: Once | INTRAMUSCULAR | Status: AC
  Administered 2021-09-17: 30 mg via INTRAMUSCULAR

## 2021-09-17 MED ORDER — KETOROLAC TROMETHAMINE 60 MG/2ML IM SOLN
INTRAMUSCULAR | Status: AC
  Filled 2021-09-17: qty 2

## 2021-09-17 MED ORDER — CYCLOBENZAPRINE HCL 10 MG PO TABS: 10.0000 mg | ORAL_TABLET | Freq: Every day | ORAL | 0 refills | Status: DC

## 2021-09-17 NOTE — ED Triage Notes (Signed)
Pt reports left back and chest pains that started last night that has been intermittent through out the day.  ?

## 2021-09-17 NOTE — ED Provider Notes (Signed)
?[facility] ? ? ? ?CSN: [id number] ?Arrival date & time: 09/17/21  1814 ? ? ?  ? ?History   ?Chief Complaint ?Chief Complaint  ?Patient presents with  ? Chest Pain  ? Back Pain  ? ? ?HPI ?Nancy Miles is a 46 y.o. female.  ? ?Patient presents with constant left-sided back pain radiating to the left side of chest starting last night.  Pain is described as sharp, rating it 8 out of 10.  Pain is worsened with deep breathing, independent of movement.  Pain does not radiate.  Denies precipitating event, pushing, pulling or lifting.  Denies personal cardiac history or familial cardiac history.  Denies shortness of breath, dizziness, lightheadedness, abdominal pain, numbness, tingling, generalized weakness, memory or speech changes.  ? ?Past Medical History:  ?Diagnosis Date  ? Headache   ? Irregular menses   ? from PCP notes  ? Low back pain   ? from PCP notes  ? Supervision of high risk pregnancy, antepartum 06/11/2018  ? Marland Kitchen. Nursing Staff Provider Office Location [facility] Dating  LMP Language   English & [employer] An[employer] US   Flu Vaccine   Genetic Screen  NIPS:  Low risk   TDaP vaccine    Hgb A1C or  GTT Early  Third trimester  Rhogam     LAB RESULTS  Feeding Plan Breast/Bottle Blood Type O/Positive/-- (12/20 0959)  Contraception Undecided Antibody Negative (12/20 0959) Circumcision  Rubella 3.16 (12/20 0959) Pe  ? 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 ? ?Patient Active Problem List  ? Diagnosis Date Noted  ? 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 H/O: C-section 06/11/2018  ? ? ?Past Surgical History:  ?Procedure Laterality Date  ? CESAREAN SECTION    ? transverse lie  ? ? ?OB History   ? ? Gravida  ?6  ? Para  ?4  ? Term  ?4  ? Preterm  ?   ? AB  ?2  ? Living  ?4  ?  ? ? SAB  ?2  ? IAB  ?   ? Ectopic  ?   ? Multiple  ?0  ? Live Births  ?4  ?   ?  ?  ? ? ? ?Home Medications   ? ?Prior to Admission medications   ?Medication Sig Start Date End Date Taking? Authorizing Provider   ?Acetaminophen (TYLENOL PO) Take by mouth.    [provider]  ?Benzocaine-Menthol (CEPACOL) 15-2.3 MG LOZG Use as directed 1 lozenge in the mouth or throat as needed. 04/24/21   Merrilee Jansky, MD  ?ibuprofen (ADVIL) 600 MG tablet Take 1 tablet (600 mg total) by mouth every 6 (six) hours as needed. 01/19/20   Eustace Moore, MD  ?levonorgestrel (LILETTA, 52 MG,) 19.5 MCG/DAY IUD IUD 1 each by Intrauterine route once.    [provider]  ?levothyroxine (SYNTHROID) 25 MCG tablet Take 25 mcg by mouth daily before breakfast.    [provider]  ?phenol (CHLORASEPTIC) 1.4 % LIQD Use as directed 1 spray in the mouth or throat as needed for throat irritation / pain. 04/24/21   LampteyBritta Mccreedy, MD  ?Prenatal Vit-Fe Phos-FA-Omega (VITAFOL GUMMIES) 3.33-0.333-34.8 MG CHEW Chew 1 tablet by mouth daily. 10/12/19   Brock Bad, MD  ?VITAMIN D PO Take by mouth.    [provider]  ? ? ?Family History ?Family History  ?Problem Relation Age of Onset  ? Healthy Mother   ?  Asthma Mother   ? Other Neg Hx   ? Migraines Neg Hx   ? ? ?Social History ?Social History  ? ?Tobacco Use  ? Smoking status: Never  ? Smokeless tobacco: Never  ?Vaping Use  ? Vaping Use: Never used  ?Substance Use Topics  ? Alcohol use: No  ? Drug use: No  ? ? ? ?Allergies   ?Patient has no known allergies. ? ? ?Review of Systems ?Review of Systems  ?Constitutional: Negative.   ?Musculoskeletal:  Positive for back pain. Negative for arthralgias, gait problem, joint swelling, myalgias, neck pain and neck stiffness.  ?Skin: Negative.   ? ? ?Physical Exam ?Triage Vital Signs ?ED Triage Vitals  ?Enc Vitals Group  ?   BP 09/17/21 1908 118/77  ?   Pulse Rate 09/17/21 1908 81  ?   Resp 09/17/21 1908 18  ?   Temp 09/17/21 1908 98.2 ?F (36.8 ?C)  ?   Temp Source 09/17/21 1908 Oral  ?   SpO2 09/17/21 1908 99 %  ?   Weight --   ?   Height --   ?   Head Circumference --   ?   Peak Flow --   ?   Pain Score 09/17/21 1907 8  ?    Pain Loc --   ?   Pain Edu? --   ?   Excl. in GC? --   ? ?No data found. ? ?Updated Vital Signs ?BP 118/77 (BP Location: Right Arm)   Pulse 81   Temp 98.2 ?F (36.8 ?C) (Oral)   Resp 18   SpO2 99%  ? ?Visual Acuity ?Right Eye Distance:   ?Left Eye Distance:   ?Bilateral Distance:   ? ?Right Eye Near:   ?Left Eye Near:    ?Bilateral Near:    ? ?Physical Exam ?Constitutional:   ?   Appearance: Normal appearance.  ?HENT:  ?   Head: Normocephalic.  ?Eyes:  ?   Extraocular Movements: Extraocular movements intact.  ?Cardiovascular:  ?   Rate and Rhythm: Normal rate and regular rhythm.  ?   Pulses: Normal pulses.  ?   Heart sounds: Normal heart sounds.  ?Pulmonary:  ?   Effort: Pulmonary effort is normal.  ?   Breath sounds: Normal breath sounds.  ?Musculoskeletal:  ?   Cervical back: Normal.  ?   Lumbar back: Normal.  ?   Comments: Tenderness over the left thoracic region, no swelling, deformity, ecchymosis or spasming noted, range of motion intact  ?Neurological:  ?   Mental Status: She is alert and oriented to person, place, and time. Mental status is at baseline.  ?Psychiatric:     ?   Mood and Affect: Mood normal.     ?   Behavior: Behavior normal.  ? ? ? ?UC Treatments / Results  ?Labs ?(all labs ordered are listed, but only abnormal results are displayed) ?Labs Reviewed - No data to display ? ?EKG ? ? ?Radiology ?No results found. ? ?Procedures ?Procedures (including critical care time) ? ?Medications Ordered in UC ?Medications - No data to display ? ?Initial Impression / Assessment and Plan / UC Course  ?I have reviewed the triage vital signs and the nursing notes. ? ?Pertinent labs & imaging results that were available during my care of the patient were reviewed by me and considered in my medical decision making (see chart for details). ? ?Acute left-sided thoracic back pain ? ?Vital signs are stable, patient in no signs of distress,  low suspicion of cardiac involvement, EKG showing normal sinus and normal  pace, S1-S2 heard on auscultation no cardiac history, will move forward with treatment as muscular pain, discussed with patient, Toradol injection given in office, prescribed naproxen twice daily for 7 days and Flexeril to be used as needed as bedtime, recommended RICE, heat, pillows for support and activity as tolerated, given strict precautions for worsening symptoms to go to the nearest emergency department for further evaluation and management ?Final Clinical Impressions(s) / UC Diagnoses  ? ?Final diagnoses:  ?None  ? ?Discharge Instructions   ?None ?  ? ?ED Prescriptions   ?None ?  ? ?PDMP not reviewed this encounter. ?  ?Valinda Hoar, NP ?09/20/21 1017 ? ?

## 2021-09-17 NOTE — Discharge Instructions (Addendum)
Your vital signs look good today, your EKG showed that your heart is beating at a normal pace and rhythm and your lungs are clear on exam, I have a low suspicion that the cause of your symptoms today as your heart however ? ?Due to the capabilities of the urgent care I am unable to do a complete cardiac work-up therefore point if your symptoms continue to persist or worsen in severity you will need to go to the nearest emergency department to be evaluated ? ?Today we will move forward with treatment of your symptoms as muscular back pain and you have been given an injection here in office ? ?Starting tomorrow take naproxen twice a day for the next 5 days, this medication is to for further assist with your pain and reduce inflammation ? ?You may use muscle relaxer as needed for additional comfort at bedtime, be mindful this medication may make you drowsy ? ?You may use heating pad in 15 minute intervals as needed for additional comfort or you may find comfort in using ice in 10-15 minutes over affected area ? ?Begin stretching affected area daily for 10 minutes as tolerated to further loosen muscles  ? ?When lying down place pillow underneath and between knees for support ? ?Can try sleeping without pillow on firm mattress  ? ?Practice good posture: head back, shoulders back, chest forward, pelvis back and weight distributed evenly on both legs ? ?If pain persist after recommended treatment or reoccurs if may be beneficial to follow up with orthopedic specialist for evaluation, this doctor specializes in the bones and can manage your symptoms long-term with options such as but not limited to imaging, medications or physical therapy  ?  ?

## 2021-11-22 ENCOUNTER — Other Ambulatory Visit: Payer: Self-pay | Admitting: Internal Medicine

## 2021-11-22 DIAGNOSIS — Z1231 Encounter for screening mammogram for malignant neoplasm of breast: Secondary | ICD-10-CM

## 2021-12-11 ENCOUNTER — Ambulatory Visit: Payer: Medicaid Other

## 2022-01-01 ENCOUNTER — Ambulatory Visit: Payer: Medicaid Other

## 2022-01-10 ENCOUNTER — Ambulatory Visit: Payer: Medicaid Other

## 2022-01-28 ENCOUNTER — Ambulatory Visit
Admission: RE | Admit: 2022-01-28 | Discharge: 2022-01-28 | Disposition: A | Discharge status: Home / Self Care | Payer: Medicaid Other | Source: Ambulatory Visit | Attending: Internal Medicine | Admitting: Internal Medicine

## 2022-01-28 DIAGNOSIS — Z1231 Encounter for screening mammogram for malignant neoplasm of breast: Secondary | ICD-10-CM

## 2022-01-29 ENCOUNTER — Other Ambulatory Visit: Payer: Self-pay | Admitting: Internal Medicine

## 2022-01-29 DIAGNOSIS — R928 Other abnormal and inconclusive findings on diagnostic imaging of breast: Secondary | ICD-10-CM

## 2022-02-06 ENCOUNTER — Ambulatory Visit
Admission: RE | Admit: 2022-02-06 | Discharge: 2022-02-06 | Disposition: A | Discharge status: Home / Self Care | Payer: Medicaid Other | Source: Ambulatory Visit | Attending: Internal Medicine | Admitting: Internal Medicine

## 2022-02-06 ENCOUNTER — Other Ambulatory Visit: Payer: Self-pay | Admitting: Internal Medicine

## 2022-02-06 DIAGNOSIS — R928 Other abnormal and inconclusive findings on diagnostic imaging of breast: Secondary | ICD-10-CM

## 2022-02-06 DIAGNOSIS — N6320 Unspecified lump in the left breast, unspecified quadrant: Secondary | ICD-10-CM

## 2022-02-10 ENCOUNTER — Ambulatory Visit
Admission: RE | Admit: 2022-02-10 | Discharge: 2022-02-10 | Disposition: A | Discharge status: Home / Self Care | Payer: Medicaid Other | Source: Ambulatory Visit | Attending: Internal Medicine | Admitting: Internal Medicine

## 2022-02-10 DIAGNOSIS — N6320 Unspecified lump in the left breast, unspecified quadrant: Secondary | ICD-10-CM

## 2022-11-13 ENCOUNTER — Encounter (HOSPITAL_COMMUNITY): Payer: Self-pay

## 2022-11-13 ENCOUNTER — Ambulatory Visit (HOSPITAL_COMMUNITY)
Admission: EM | Admit: 2022-11-13 | Discharge: 2022-11-13 | Disposition: A | Discharge status: Home / Self Care | Payer: Medicaid Other | Attending: Sports Medicine | Admitting: Sports Medicine

## 2022-11-13 DIAGNOSIS — M545 Low back pain, unspecified: Secondary | ICD-10-CM

## 2022-11-13 MED ORDER — METHYLPREDNISOLONE SODIUM SUCC 125 MG IJ SOLR
60.0000 mg | Freq: Once | INTRAMUSCULAR | Status: AC
  Administered 2022-11-13: 60 mg via INTRAMUSCULAR

## 2022-11-13 MED ORDER — CYCLOBENZAPRINE HCL 10 MG PO TABS: 10.0000 mg | ORAL_TABLET | Freq: Every day | ORAL | 0 refills | Status: DC

## 2022-11-13 MED ORDER — KETOROLAC TROMETHAMINE 30 MG/ML IJ SOLN
INTRAMUSCULAR | Status: AC
  Filled 2022-11-13: qty 1

## 2022-11-13 MED ORDER — KETOROLAC TROMETHAMINE 30 MG/ML IJ SOLN
30.0000 mg | Freq: Once | INTRAMUSCULAR | Status: AC
  Administered 2022-11-13: 30 mg via INTRAMUSCULAR

## 2022-11-13 MED ORDER — METHYLPREDNISOLONE SODIUM SUCC 125 MG IJ SOLR
INTRAMUSCULAR | Status: AC
  Filled 2022-11-13: qty 2

## 2022-11-13 NOTE — Discharge Instructions (Addendum)
You likely have strained muscle in your back thatis causing your pain.  We gave you 2 shots today 1 of Toradol and anti-inflammatory and 1 of a steroid, methylprednisolone to help with the inflammation and pain you are having.  Recommend he continue with heat and start these gentle stretches and exercises.  Follow-up with your primary care provider if symptoms worsen or fail to improve

## 2022-11-13 NOTE — ED Provider Notes (Signed)
[facility]    CSN: [id number] Arrival date & time: 11/13/22  0930      History   Chief Complaint Chief Complaint  Patient presents with   Back Pain    HPI Nancy Miles is a 47 y.o. female.   She is here today, she provided a translator with her with chief complaint of left-sided low back pain that does radiate slightly into her buttocks.  She states this began yesterday after she was leaning over to tie her son shoe in the car when she got back up she felt some tightness in her back.  She tried heat and ibuprofen with very minimal relief.  Her pain is worse when she is seated in leaning over to lift things.  She denies any radiation of the pain down into her leg, numbness or tingling or loss of total bowel or bladder control.  She has not had anything like this in the past.   Back Pain   Past Medical History:  Diagnosis Date   Headache    Irregular menses    from PCP notes   Low back pain    from PCP notes   Supervision of high risk pregnancy, antepartum 06/11/2018   .Marland Kitchen Nursing Staff Provider Office Location [facility] Dating  LMP Language   English & [employer] An[employer] US   Flu Vaccine   Genetic Screen  NIPS:  Low risk   TDaP vaccine    Hgb A1C or  GTT Early  Third trimester  Rhogam     LAB RESULTS  Feeding Plan Breast/Bottle Blood Type O/Positive/-- (12/20 0959)  Contraception Undecided Antibody Negative (12/20 0959) Circumcision  Rubella 3.16 (12/20 0959) Pe   TB lung, 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 Authorizing Provider  cyclobenzaprine (FLEXERIL) 10 MG tablet Take 1 tablet (10 mg total) by mouth at bedtime. 11/13/22  Yes Gillermo Murdoch A, DO  Acetaminophen (TYLENOL PO) Take by mouth.    [provider]  levonorgestrel (LILETTA, 52 MG,) 19.5 MCG/DAY IUD IUD 1 each by Intrauterine route once.    [provider]  VITAMIN D PO Take by mouth.    [provider]    Family History Family History  Problem Relation Age of Onset   Healthy Mother    Asthma Mother    Other Neg Hx    Migraines Neg Hx     Social History Social History   Tobacco Use   Smoking status: Never   Smokeless tobacco: Never  Vaping Use   Vaping Use: Never used  Substance Use Topics   Alcohol use: No   Drug use: No  Allergies   Patient has no known allergies.   Review of Systems Review of Systems  Musculoskeletal:  Positive for back pain.  As listed above in HPI   Physical Exam Triage Vital Signs ED Triage Vitals  Enc Vitals Group     BP 11/13/22 1019 (!) 139/92     Pulse Rate 11/13/22 1019 74     Resp 11/13/22 1019 18     Temp 11/13/22 1019 98.4 F (36.9 C)     Temp Source 11/13/22 1019 Oral     SpO2 11/13/22 1019 97 %     Weight --      Height --      Head Circumference --      Peak Flow --      Pain Score 11/13/22 1020 10     Pain Loc --      Pain Edu? --      Excl. in GC? --    No data found.  Updated Vital Signs BP (!) 139/92 (BP Location: Left Arm)   Pulse 74   Temp 98.4 F (36.9 C) (Oral)   Resp 18   SpO2 97%   Breastfeeding No   Physical Exam Vitals reviewed.  Constitutional:      General: She is not in acute distress.    Appearance: Normal appearance. She is not toxic-appearing or diaphoretic.     Comments: She appears uncomfortable switching positions in the room frequently throughout my exam  HENT:     Head: Normocephalic.  Eyes:     Extraocular Movements:  Extraocular movements intact.     Conjunctiva/sclera: Conjunctivae normal.  Cardiovascular:     Rate and Rhythm: Normal rate.  Pulmonary:     Effort: Pulmonary effort is normal.  Skin:    General: Skin is warm.  Neurological:     Mental Status: She is alert.   Lumbar spine: No obvious deformity or asymmetry.  No midline tenderness or step-off appreciated.  Tenderness to palpation of the quadratus lumborum but greatest at the left PSIS.  Negative seated straight leg raise testing.  Normal gait.  Strength 5/5 resisted hip flexion.  Slow to transition from the standing to sitting position and seated to standing.   UC Treatments / Results  Labs (all labs ordered are listed, but only abnormal results are displayed) Labs Reviewed - No data to display  EKG   Radiology No results found.  Procedures Procedures (including critical care time)  Medications Ordered in UC Medications  ketorolac (TORADOL) 30 MG/ML injection 30 mg (has no administration in time range)  methylPREDNISolone sodium succinate (SOLU-MEDROL) 125 mg/2 mL injection 60 mg (has no administration in time range)    Initial Impression / Assessment and Plan / UC Course  I have reviewed the triage vital signs and the nursing notes.  Pertinent labs & imaging results that were available during my care of the patient were reviewed by me and considered in my medical decision making (see chart for details).     Low back pain, lumbar strain 30 mg IM Toradol injection, 60 mg Solu-Medrol IM administered today.  I also sent to her pharmacy some Flexeril.  She was given a handout of some gentle stretches and exercises to begin after her pain begins to resolve.  Work note was also provided.  Encouraged her to follow-up with her primary care provider if symptoms worsen or fail to improve.  She verbalized understanding Final Clinical Impressions(s) / UC Diagnoses   Final diagnoses:  Acute  left-sided low back pain, unspecified  whether sciatica present     Discharge Instructions      You likely have strained muscle in your back thatis causing your pain.  We gave you 2 shots today 1 of Toradol and anti-inflammatory and 1 of a steroid, methylprednisolone to help with the inflammation and pain you are having.  Recommend he continue with heat and start these gentle stretches and exercises.  Follow-up with your primary care provider if symptoms worsen or fail to improve   ED Prescriptions     Medication Sig Dispense Auth. Provider   cyclobenzaprine (FLEXERIL) 10 MG tablet Take 1 tablet (10 mg total) by mouth at bedtime. 20 tablet Gillermo Murdoch A, DO      PDMP not reviewed this encounter.   Gillermo Murdoch A, DO 11/13/22 1051

## 2022-11-13 NOTE — ED Triage Notes (Signed)
Pt states yesterday she was bent over tieing her sons shoe and when she stood up she pulled something in her back. C/o lower back pain radiating down lt leg. States used heat packs and took ibuprofen with no relief.

## 2023-04-25 ENCOUNTER — Encounter (HOSPITAL_COMMUNITY): Payer: Self-pay

## 2023-04-25 ENCOUNTER — Ambulatory Visit (HOSPITAL_COMMUNITY)
Admission: EM | Admit: 2023-04-25 | Discharge: 2023-04-25 | Disposition: A | Discharge status: Home / Self Care | Payer: Medicaid Other | Attending: Emergency Medicine | Admitting: Emergency Medicine

## 2023-04-25 DIAGNOSIS — J029 Acute pharyngitis, unspecified: Secondary | ICD-10-CM

## 2023-04-25 DIAGNOSIS — R590 Localized enlarged lymph nodes: Secondary | ICD-10-CM

## 2023-04-25 LAB — POC COVID19/FLU A&B COMBO
Covid Antigen, POC: NEGATIVE
Influenza A Antigen, POC: NEGATIVE
Influenza B Antigen, POC: NEGATIVE

## 2023-04-25 LAB — POCT RAPID STREP A (OFFICE): Rapid Strep A Screen: NEGATIVE

## 2023-04-25 MED ORDER — PREDNISONE 10 MG PO TABS: ORAL_TABLET | ORAL | 0 refills | Status: DC

## 2023-04-25 MED ORDER — AMOXICILLIN-POT CLAVULANATE 875-125 MG PO TABS: 1.0000 | ORAL_TABLET | Freq: Two times a day (BID) | ORAL | 0 refills | Status: DC

## 2023-04-25 NOTE — ED Provider Notes (Signed)
[facility]    CSN: [id number] Arrival date & time: 04/25/23  1001      History   Chief Complaint Chief Complaint  Patient presents with   Sore Throat    gen   Generalized Body Aches   Otalgia    HPI Nancy Miles is a 47 y.o. female.   Patient is reporting symptoms x 1 day.  She is reporting sore throat bilateral ear pain swollen cervical lymph nodes chills and bodyaches.  She has had no ill exposures.  The history is provided by the patient.  Sore Throat This is a new problem. Associated symptoms include headaches. Treatments tried: Ibuprofen.  Otalgia Associated symptoms: headaches and sore throat     Past Medical History:  Diagnosis Date   Headache    Irregular menses    from PCP notes   Low back pain    from PCP notes   Supervision of high risk pregnancy, antepartum 06/11/2018   .Marland Kitchen Nursing Staff Provider Office Location [facility] Dating  LMP Language   English & Trigrinya Anatomy US   Flu Vaccine   Genetic Screen  NIPS:  Low risk   TDaP vaccine    Hgb A1C or  GTT Early  Third trimester  Rhogam     LAB RESULTS  Feeding Plan Breast/Bottle Blood Type O/Positive/-- (12/20 0959)  Contraception Undecided Antibody Negative (12/20 0959) Circumcision  Rubella 3.16 (12/20 0959) Pe   TB lung, 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 Authorizing Provider  amoxicillin-clavulanate (AUGMENTIN) 875-125 MG tablet Take 1 tablet by mouth every 12 (twelve)  hours. 04/25/23  Yes Aaron Boeh, Linde Gillis, NP  predniSONE (DELTASONE) 10 MG tablet Take 2 tabs daily x 3 days then take 1 tab daily x 3 days 04/25/23  Yes Roselyn Doby, Linde Gillis, NP  Acetaminophen (TYLENOL PO) Take by mouth.    [provider]  levonorgestrel (LILETTA, 52 MG,) 19.5 MCG/DAY IUD IUD 1 each by Intrauterine route once.    [provider]    Family History Family History  Problem Relation Age of Onset   Healthy Mother    Asthma Mother    Other Neg Hx    Migraines Neg Hx     Social History Social History   Tobacco Use   Smoking status: Never   Smokeless tobacco: Never  Vaping Use   Vaping status: Never Used  Substance Use Topics   Alcohol use: No   Drug use: No     Allergies   Patient has no known allergies.   Review of Systems Review of Systems  Constitutional:  Positive for chills and fatigue.  HENT:  Positive for ear  pain and sore throat.   Gastrointestinal: Negative.   Genitourinary: Negative.   Musculoskeletal: Negative.   Skin: Negative.   Neurological:  Positive for headaches.     Physical Exam Triage Vital Signs ED Triage Vitals  Encounter Vitals Group     BP 04/25/23 1014 123/77     Systolic BP Percentile --      Diastolic BP Percentile --      Pulse Rate 04/25/23 1014 (!) 105     Resp 04/25/23 1014 16     Temp 04/25/23 1014 99 F (37.2 C)     Temp Source 04/25/23 1014 Oral     SpO2 04/25/23 1014 95 %     Weight --      Height --      Head Circumference --      Peak Flow --      Pain Score 04/25/23 1017 8     Pain Loc --      Pain Education --      Exclude from Growth Chart --    No data found.  Updated Vital Signs BP 123/77 (BP Location: Left Arm)   Pulse (!) 105   Temp 99 F (37.2 C) (Oral)   Resp 16   SpO2 95%   Visual Acuity Right Eye Distance:   Left Eye Distance:   Bilateral Distance:    Right Eye Near:   Left Eye Near:    Bilateral Near:     Physical Exam Vitals reviewed.  Constitutional:       Appearance: She is ill-appearing.  HENT:     Nose:     Comments: Enlarged turbinates erythemic bilateral    Mouth/Throat:     Pharynx: Pharyngeal swelling and posterior oropharyngeal erythema present.     Tonsils: 3+ on the right. 3+ on the left.  Cardiovascular:     Rate and Rhythm: Regular rhythm. Tachycardia present.     Heart sounds: Normal heart sounds.  Pulmonary:     Effort: Pulmonary effort is normal.  Abdominal:     Palpations: Abdomen is soft.  Lymphadenopathy:     Cervical: Cervical adenopathy present.  Neurological:     Mental Status: She is alert.      UC Treatments / Results  Labs (all labs ordered are listed, but only abnormal results are displayed) Labs Reviewed  CULTURE, GROUP A STREP ([facility])  POC COVID19/FLU A&B COMBO  POCT RAPID STREP A (OFFICE)    EKG   Radiology No results found.  Procedures Procedures (including critical care time)  Medications Ordered in UC Medications - No data to display  Initial Impression / Assessment and Plan / UC Course  I have reviewed the triage vital signs and the nursing notes.  Pertinent labs & imaging results that were available during my care of the patient were reviewed by me and considered in my medical decision making (see chart for details).   We will swab for COVID/and strep. All testing are negative.  We will treat her for an acute pharyngitis since she does have swollen lymph nodes and swollen tonsillar area with fever we will go ahead and order antibiotics as well as prednisone.  Final Clinical Impressions(s) / UC Diagnoses   Final diagnoses:  Acute pharyngitis, unspecified etiology  Anterior cervical adenopathy     Discharge Instructions      Take medications as ordered.   May take ibuprofen 800 mg twice a day and/or Tylenol 1000 mg 3 times a day for fever and  bodyaches. Follow-up with PCP if worsening of symptoms.     ED Prescriptions     Medication Sig Dispense Auth. Provider    predniSONE (DELTASONE) 10 MG tablet Take 2 tabs daily x 3 days then take 1 tab daily x 3 days 9 tablet Aleem Elza, Linde Gillis, NP   amoxicillin-clavulanate (AUGMENTIN) 875-125 MG tablet Take 1 tablet by mouth every 12 (twelve) hours. 14 tablet Qaadir Kent, Linde Gillis, NP      PDMP not reviewed this encounter.   Nelda Marseille, NP 05/09/23 [phone]

## 2023-04-25 NOTE — Discharge Instructions (Addendum)
Take medications as ordered.   May take ibuprofen 800 mg twice a day and/or Tylenol 1000 mg 3 times a day for fever and bodyaches. Follow-up with PCP if worsening of symptoms.

## 2023-04-25 NOTE — ED Triage Notes (Signed)
Patient c/o bilateral ear pain, sore throat, and body aches since yesterday.  Patient states she has taken ibuprofen and last night was the last dose.

## 2023-04-28 LAB — CULTURE, GROUP A STREP (THRC)

## 2023-10-16 ENCOUNTER — Ambulatory Visit: Payer: Self-pay | Admitting: Family Medicine

## 2023-10-16 ENCOUNTER — Encounter: Payer: Self-pay | Admitting: Family Medicine

## 2023-10-16 VITALS — BP 126/80 | HR 86 | Temp 98.6°F | Ht 62.0 in | Wt 159.0 lb

## 2023-10-16 DIAGNOSIS — Z79899 Other long term (current) drug therapy: Secondary | ICD-10-CM

## 2023-10-16 DIAGNOSIS — Z227 Latent tuberculosis: Secondary | ICD-10-CM | POA: Diagnosis not present

## 2023-10-16 DIAGNOSIS — Z136 Encounter for screening for cardiovascular disorders: Secondary | ICD-10-CM

## 2023-10-16 DIAGNOSIS — E663 Overweight: Secondary | ICD-10-CM | POA: Diagnosis not present

## 2023-10-16 DIAGNOSIS — Z1231 Encounter for screening mammogram for malignant neoplasm of breast: Secondary | ICD-10-CM

## 2023-10-16 DIAGNOSIS — Z6829 Body mass index (BMI) 29.0-29.9, adult: Secondary | ICD-10-CM

## 2023-10-16 DIAGNOSIS — E039 Hypothyroidism, unspecified: Secondary | ICD-10-CM

## 2023-10-16 LAB — CBC WITH DIFFERENTIAL/PLATELET
Basophils Absolute: 0 10*3/uL (ref 0.0–0.1)
Basophils Relative: 0.8 % (ref 0.0–3.0)
Eosinophils Absolute: 0.1 10*3/uL (ref 0.0–0.7)
Eosinophils Relative: 1.7 % (ref 0.0–5.0)
HCT: 37.4 % (ref 36.0–46.0)
Hemoglobin: 12.9 g/dL (ref 12.0–15.0)
Lymphocytes Relative: 43.7 % (ref 12.0–46.0)
Lymphs Abs: 2.4 10*3/uL (ref 0.7–4.0)
MCHC: 34.5 g/dL (ref 30.0–36.0)
MCV: 92.5 fl (ref 78.0–100.0)
Monocytes Absolute: 0.5 10*3/uL (ref 0.1–1.0)
Monocytes Relative: 9.1 % (ref 3.0–12.0)
Neutro Abs: 2.4 10*3/uL (ref 1.4–7.7)
Neutrophils Relative %: 44.7 % (ref 43.0–77.0)
Platelets: 372 10*3/uL (ref 150.0–400.0)
RBC: 4.04 Mil/uL (ref 3.87–5.11)
RDW: 13 % (ref 11.5–15.5)
WBC: 5.4 10*3/uL (ref 4.0–10.5)

## 2023-10-16 LAB — LIPID PANEL
Cholesterol: 167 mg/dL (ref 0–200)
HDL: 45.8 mg/dL (ref >39.00)
LDL Cholesterol: 100 mg/dL — ABNORMAL HIGH (ref 0–99)
NonHDL: 121.06
Total CHOL/HDL Ratio: 4
Triglycerides: 105 mg/dL (ref 0.0–149.0)
VLDL: 21 mg/dL (ref 0.0–40.0)

## 2023-10-16 LAB — COMPREHENSIVE METABOLIC PANEL WITH GFR
ALT: 14 U/L (ref 0–35)
AST: 18 U/L (ref 0–37)
Albumin: 4.1 g/dL (ref 3.5–5.2)
Alkaline Phosphatase: 66 U/L (ref 39–117)
BUN: 10 mg/dL (ref 6–23)
CO2: 29 meq/L (ref 19–32)
Calcium: 9.1 mg/dL (ref 8.4–10.5)
Chloride: 104 meq/L (ref 96–112)
Creatinine, Ser: 0.57 mg/dL (ref 0.40–1.20)
GFR: 107.23 mL/min (ref >60.00)
Glucose, Bld: 92 mg/dL (ref 70–99)
Potassium: 4.2 meq/L (ref 3.5–5.1)
Sodium: 136 meq/L (ref 135–145)
Total Bilirubin: 0.7 mg/dL (ref 0.2–1.2)
Total Protein: 6.6 g/dL (ref 6.0–8.3)

## 2023-10-16 LAB — TSH: TSH: 7.12 u[IU]/mL — ABNORMAL HIGH (ref 0.35–5.50)

## 2023-10-16 MED ORDER — LEVOTHYROXINE SODIUM 25 MCG PO TABS: 25.0000 ug | ORAL_TABLET | Freq: Every day | ORAL | 3 refills | Status: AC

## 2023-10-16 NOTE — Addendum Note (Signed)
 Addended by: Wellington Half on: 10/16/2023 04:33 PM   Modules accepted: Orders

## 2023-10-16 NOTE — Patient Instructions (Addendum)

## 2023-10-16 NOTE — Progress Notes (Signed)
 New Patient Office Visit  Subjective    Patient ID: Nancy Miles, female    DOB: [birth year]  Age: 48 y.o. MRN: [id number]  CC:  Chief Complaint  Patient presents with   Establish Care    HPI Nancy Miles presents to establish care today. She is not currently taking any prescription medications. Has history of hypothyroidism, has been out of levothyroxine for about the last 2 years. Has history of latent TB as well, was treated per patient. Has IUD in place. Se[facility] O[facility]. Requesting order for screening mammogram today, hx breast biopsies with clips placed.  Last mammogram was about a year ago per pt. Denies other concerns today.  Outpatient Encounter Medications as of 10/16/2023  Medication Sig   Acetaminophen  (TYLENOL  PO) Take by mouth.   levonorgestrel  (LILETTA , 52 MG,) 19.5 MCG/DAY IUD IUD 1 each by Intrauterine route once.   [DISCONTINUED] amoxicillin -clavulanate (AUGMENTIN ) 875-125 MG tablet Take 1 tablet by mouth every 12 (twelve) hours. (Patient not taking: Reported on 10/16/2023)   [DISCONTINUED] predniSONE  (DELTASONE ) 10 MG tablet Take 2 tabs daily x 3 days then take 1 tab daily x 3 days (Patient not taking: Reported on 10/16/2023)   No facility-administered encounter medications on file as of 10/16/2023.    Past Medical History:  Diagnosis Date   Headache    Irregular menses    from PCP notes   Low back pain    from PCP notes   Supervision of high risk pregnancy, antepartum 06/11/2018   .Aaron Aas Nursing Staff Provider Office Location [facility] Dating  LMP Language   English & Trigrinya Anatomy US    Flu Vaccine   Genetic Screen  NIPS:  Low risk   TDaP vaccine    Hgb A1C or  GTT Early  Third trimester  Rhogam     LAB RESULTS  Feeding Plan Breast/Bottle Blood Type O/Positive/-- (12/20 0959)  Contraception Undecided Antibody Negative (12/20 0959) Circumcision  Rubella 3.16 (12/20 0959) Pe   TB lung, 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    Past Surgical History:   Procedure Laterality Date   CESAREAN SECTION     transverse lie    Family History  Problem Relation Age of Onset   Healthy Mother    Asthma Mother    Other Neg Hx    Migraines Neg Hx     Social History   Socioeconomic History   Marital status: Married    Spouse name: Not on file   Number of children: 4   Years of education: Not on file   Highest education level: High school graduate  Occupational History   Not on file  Tobacco Use   Smoking status: Never   Smokeless tobacco: Never  Vaping Use   Vaping status: Never Used  Substance and Sexual Activity   Alcohol use: No   Drug use: No   Sexual activity: Yes    Birth control/protection: I.U.D.  Other Topics Concern   Not on file  Social History Narrative   Lives at home with husband and kids   Right handed Caffeine : 1 tea in the morning, 2 cups of coffee with milk daily   Social Drivers of Health   Financial Resource Strain: Not on file  Food Insecurity: Unknown (12/05/2018)   Hunger Vital Sign    Worried About Running Out of Food in the Last Year: Patient declined    Ran Out of Food in the Last Year: Patient declined  Transportation Needs: Unknown (12/05/2018)  PRAPARE - Administrator, Civil Service (Medical): Patient declined    Lack of Transportation (Non-Medical): Patient declined  Physical Activity: Not on file  Stress: Not on file  Social Connections: Not on file  Intimate Partner Violence: Not on file    ROS Per HPI      Objective    BP 126/80 (BP Location: Left Arm, Patient Position: Sitting)   Pulse 86   Temp 98.6 F (37 C) (Temporal)   Ht 5\' 2"  (1.575 m)   Wt 159 lb (72.1 kg)   SpO2 97%   BMI 29.08 kg/m   Physical Exam Vitals and nursing note reviewed.  Constitutional:      General: She is not in acute distress.    Appearance: Normal appearance.  HENT:     Head: Normocephalic and atraumatic.     Right Ear: External ear normal.     Left Ear: External ear normal.      Nose: Nose normal.     Mouth/Throat:     Mouth: Mucous membranes are moist.     Pharynx: Oropharynx is clear.  Eyes:     Extraocular Movements: Extraocular movements intact.     Pupils: Pupils are equal, round, and reactive to light.  Cardiovascular:     Rate and Rhythm: Normal rate and regular rhythm.     Pulses: Normal pulses.     Heart sounds: Normal heart sounds.  Pulmonary:     Effort: Pulmonary effort is normal. No respiratory distress.     Breath sounds: Normal breath sounds. No wheezing, rhonchi or rales.  Musculoskeletal:        General: Normal range of motion.     Cervical back: Normal range of motion.     Right lower leg: No edema.     Left lower leg: No edema.  Lymphadenopathy:     Cervical: No cervical adenopathy.  Neurological:     General: No focal deficit present.     Mental Status: She is alert and oriented to person, place, and time.  Psychiatric:        Mood and Affect: Mood normal.        Thought Content: Thought content normal.        Assessment & Plan:   Acquired hypothyroidism -     TSH  Encounter for screening for cardiovascular disorders -     Lipid panel  Medication management -     CBC with Differential/Platelet -     Comprehensive metabolic panel with GFR -     Lipid panel -     TSH  Encounter for screening mammogram for malignant neoplasm of breast -     3D Screening Mammogram, Left and Right; Future  Overweight with body mass index (BMI) of 29 to 29.9 in adult  TB lung, latent -     CBC with Differential/Platelet -     Comprehensive metabolic panel with GFR     Return in about 8 weeks (around 12/11/2023) for labs/meds.   Wellington Half, FNP

## 2023-10-21 ENCOUNTER — Telehealth: Payer: Self-pay | Admitting: Family Medicine

## 2023-10-21 NOTE — Telephone Encounter (Signed)
 Copied from CRM [id number]. Topic: Referral - Question >> Oct 21, 2023  3:23 PM Nancy Miles wrote: Reason for CRM: Pt would like a new referral for the mammogram. States the current order is not accepted by the previous referral.

## 2023-10-22 NOTE — Telephone Encounter (Signed)
 Spoke with patient, provided her with the Mobile Mammogram Bus information for 05/19 at our office sent information via mychart for her to have as well

## 2023-12-17 DIAGNOSIS — E039 Hypothyroidism, unspecified: Secondary | ICD-10-CM | POA: Insufficient documentation

## 2023-12-17 DIAGNOSIS — Z79899 Other long term (current) drug therapy: Secondary | ICD-10-CM | POA: Insufficient documentation

## 2023-12-17 NOTE — Progress Notes (Signed)
 Established Patient Office Visit  Subjective   Patient ID: Nancy Miles, female    DOB: 19-Aug-1975  Age: 48 y.o. MRN: [id number]  Chief Complaint  Patient presents with   Follow-up    HPI Patient presents today for medication management. Reports compliance with medication regimen.  Reports that she has an itchy patchy rash to the back of her neck.  States this has been there for the last couple of days since it has been really hot outside.  Has been using Vaseline to keep the area moisturized.  States that this helps just a little bit.  States that the skin is darker than the surrounding skin, inquiring about what can be done to to help treat this. Medical history as outlined below.  ROS Per HPI    Objective:     BP 120/70 (BP Location: Left Arm, Patient Position: Sitting)   Pulse 74   Temp 97.8 F (36.6 C) (Temporal)   Ht 5' 2 (1.575 m)   Wt 159 lb 6.4 oz (72.3 kg)   SpO2 99%   BMI 29.15 kg/m   Physical Exam Vitals and nursing note reviewed.  Constitutional:      General: She is not in acute distress.    Appearance: Normal appearance.  HENT:     Head: Normocephalic and atraumatic.     Right Ear: External ear normal.     Left Ear: External ear normal.     Nose: Nose normal.     Mouth/Throat:     Mouth: Mucous membranes are moist.     Pharynx: Oropharynx is clear.   Eyes:     Extraocular Movements: Extraocular movements intact.     Pupils: Pupils are equal, round, and reactive to light.    Cardiovascular:     Rate and Rhythm: Normal rate and regular rhythm.     Pulses: Normal pulses.     Heart sounds: Normal heart sounds.  Pulmonary:     Effort: Pulmonary effort is normal. No respiratory distress.     Breath sounds: Normal breath sounds. No wheezing, rhonchi or rales.   Musculoskeletal:        General: Normal range of motion.     Cervical back: Normal range of motion.     Right lower leg: No edema.     Left lower leg: No edema.  Lymphadenopathy:      Cervical: No cervical adenopathy.   Skin:        Comments: Area of hyperpigmented patchy rash.  No swelling, no discharge, no bleeding from the area.  Nontender.   Neurological:     General: No focal deficit present.     Mental Status: She is alert and oriented to person, place, and time.   Psychiatric:        Mood and Affect: Mood normal.        Thought Content: Thought content normal.     No results found for any visits on 12/18/23.   The 10-year ASCVD risk score (Arnett DK, et al., 2019) is: 1.3%    Assessment & Plan:   Acquired hypothyroidism Assessment & Plan: TSH today Will dose adjust levothyroxine  as needed  Orders: -     TSH  Rash Assessment & Plan: Triamcinolone 3 times daily as needed May continue Vaseline  Orders: -     Triamcinolone Acetonide; Apply 1 Application topically 3 (three) times daily.  Dispense: 30 g; Refill: 0  Encounter for screening mammogram for malignant neoplasm of breast Assessment &  Plan: Mammogram ordered  Orders: -     3D Screening Mammogram, Left and Right; Future  Medication management Assessment & Plan: TSH today, will dose adjust as needed  Orders: -     TSH     Return in about 2 months (around 02/17/2024) for tsh.    Corean LITTIE Ku, FNP

## 2023-12-18 ENCOUNTER — Ambulatory Visit: Admitting: Family Medicine

## 2023-12-18 ENCOUNTER — Encounter: Payer: Self-pay | Admitting: Family Medicine

## 2023-12-18 ENCOUNTER — Ambulatory Visit: Payer: Self-pay | Admitting: Family Medicine

## 2023-12-18 VITALS — BP 120/70 | HR 74 | Temp 97.8°F | Ht 62.0 in | Wt 159.4 lb

## 2023-12-18 DIAGNOSIS — E039 Hypothyroidism, unspecified: Secondary | ICD-10-CM | POA: Diagnosis not present

## 2023-12-18 DIAGNOSIS — Z79899 Other long term (current) drug therapy: Secondary | ICD-10-CM | POA: Diagnosis not present

## 2023-12-18 DIAGNOSIS — Z1231 Encounter for screening mammogram for malignant neoplasm of breast: Secondary | ICD-10-CM | POA: Diagnosis not present

## 2023-12-18 DIAGNOSIS — R21 Rash and other nonspecific skin eruption: Secondary | ICD-10-CM | POA: Insufficient documentation

## 2023-12-18 LAB — TSH: TSH: 4.83 u[IU]/mL (ref 0.35–5.50)

## 2023-12-18 MED ORDER — TRIAMCINOLONE ACETONIDE 0.5 % EX CREA: 1.0000 | TOPICAL_CREAM | Freq: Three times a day (TID) | CUTANEOUS | 0 refills | Status: AC

## 2023-12-18 NOTE — Assessment & Plan Note (Signed)
 Triamcinolone 3 times daily as needed May continue Vaseline

## 2023-12-18 NOTE — Assessment & Plan Note (Signed)
 TSH today, will dose adjust as needed

## 2023-12-18 NOTE — Assessment & Plan Note (Signed)
 Mammogram ordered

## 2023-12-18 NOTE — Assessment & Plan Note (Signed)
 TSH today Will dose adjust levothyroxine  as needed
